# Patient Record
Sex: Female | Born: 1985
Health system: Southern US, Community
[De-identification: ages and names within clinical notes are randomized; demographics above are authoritative.]

## PROBLEM LIST (undated history)

## (undated) DIAGNOSIS — J45909 Unspecified asthma, uncomplicated: Secondary | ICD-10-CM

## (undated) DIAGNOSIS — L309 Dermatitis, unspecified: Secondary | ICD-10-CM

## (undated) DIAGNOSIS — E669 Obesity, unspecified: Secondary | ICD-10-CM

## (undated) DIAGNOSIS — I10 Essential (primary) hypertension: Secondary | ICD-10-CM

## (undated) DIAGNOSIS — L509 Urticaria, unspecified: Secondary | ICD-10-CM

## (undated) HISTORY — DX: Unspecified asthma, uncomplicated: J45.909

## (undated) HISTORY — DX: Urticaria, unspecified: L50.9

## (undated) HISTORY — PX: TYMPANOSTOMY TUBE PLACEMENT: SHX32

## (undated) HISTORY — PX: TUBAL LIGATION: SHX77

## (undated) HISTORY — DX: Dermatitis, unspecified: L30.9

## (undated) HISTORY — DX: Obesity, unspecified: E66.9

## (undated) HISTORY — DX: Essential (primary) hypertension: I10

---
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
## 2006-06-29 ENCOUNTER — Other Ambulatory Visit: Admission: RE | Admit: 2006-06-29 | Discharge: 2006-06-29 | Payer: Self-pay | Admitting: Obstetrics and Gynecology

## 2007-04-03 ENCOUNTER — Emergency Department (HOSPITAL_COMMUNITY): Admission: EM | Admit: 2007-04-03 | Discharge: 2007-04-03 | Payer: Self-pay | Admitting: Emergency Medicine

## 2007-12-11 ENCOUNTER — Inpatient Hospital Stay (HOSPITAL_COMMUNITY): Admission: AD | Admit: 2007-12-11 | Discharge: 2007-12-11 | Payer: Self-pay | Admitting: Obstetrics and Gynecology

## 2007-12-14 ENCOUNTER — Emergency Department (HOSPITAL_COMMUNITY): Admission: EM | Admit: 2007-12-14 | Discharge: 2007-12-14 | Payer: Self-pay | Admitting: Emergency Medicine

## 2008-01-11 ENCOUNTER — Inpatient Hospital Stay (HOSPITAL_COMMUNITY): Admission: AD | Admit: 2008-01-11 | Discharge: 2008-01-13 | Payer: Self-pay | Admitting: Obstetrics and Gynecology

## 2008-08-05 ENCOUNTER — Emergency Department (HOSPITAL_COMMUNITY): Admission: EM | Admit: 2008-08-05 | Discharge: 2008-08-05 | Payer: Self-pay | Admitting: Emergency Medicine

## 2009-02-06 ENCOUNTER — Encounter: Payer: Self-pay | Admitting: Family Medicine

## 2009-04-06 ENCOUNTER — Encounter: Payer: Self-pay | Admitting: Family Medicine

## 2009-04-06 ENCOUNTER — Ambulatory Visit: Payer: Self-pay | Admitting: Family Medicine

## 2009-04-07 ENCOUNTER — Encounter: Payer: Self-pay | Admitting: Family Medicine

## 2009-04-08 LAB — CONVERTED CEMR LAB
Antibody Screen: NEGATIVE
Basophils Absolute: 0 10*3/uL (ref 0.0–0.1)
Basophils Relative: 0 % (ref 0–1)
Eosinophils Absolute: 0.8 10*3/uL — ABNORMAL HIGH (ref 0.0–0.7)
Eosinophils Relative: 8 % — ABNORMAL HIGH (ref 0–5)
HCT: 32.9 % — ABNORMAL LOW (ref 36.0–46.0)
Hemoglobin: 11.1 g/dL — ABNORMAL LOW (ref 12.0–15.0)
Hepatitis B Surface Ag: NEGATIVE
Lymphocytes Relative: 16 % (ref 12–46)
Lymphs Abs: 1.6 10*3/uL (ref 0.7–4.0)
MCHC: 33.7 g/dL (ref 30.0–36.0)
MCV: 78.9 fL (ref 78.0–100.0)
Monocytes Absolute: 0.6 10*3/uL (ref 0.1–1.0)
Monocytes Relative: 6 % (ref 3–12)
Neutro Abs: 7.1 10*3/uL (ref 1.7–7.7)
Neutrophils Relative %: 70 % (ref 43–77)
Platelets: 253 10*3/uL (ref 150–400)
RBC: 4.17 M/uL (ref 3.87–5.11)
RDW: 13.9 % (ref 11.5–15.5)
Rh Type: POSITIVE
Rubella: 28 intl units/mL — ABNORMAL HIGH
Sickle Cell Screen: NEGATIVE
WBC: 10.1 10*3/uL (ref 4.0–10.5)

## 2009-04-13 ENCOUNTER — Ambulatory Visit: Payer: Self-pay | Admitting: Family Medicine

## 2009-04-13 ENCOUNTER — Encounter: Payer: Self-pay | Admitting: *Deleted

## 2009-04-13 ENCOUNTER — Encounter: Payer: Self-pay | Admitting: Family Medicine

## 2009-04-13 ENCOUNTER — Other Ambulatory Visit: Admission: RE | Admit: 2009-04-13 | Discharge: 2009-04-13 | Payer: Self-pay | Admitting: Family Medicine

## 2009-04-13 LAB — CONVERTED CEMR LAB

## 2009-04-14 LAB — CONVERTED CEMR LAB
Chlamydia, DNA Probe: NEGATIVE
GC Probe Amp, Genital: NEGATIVE

## 2009-04-15 ENCOUNTER — Encounter: Payer: Self-pay | Admitting: Family Medicine

## 2009-04-15 ENCOUNTER — Ambulatory Visit (HOSPITAL_COMMUNITY): Admission: RE | Admit: 2009-04-15 | Discharge: 2009-04-15 | Payer: Self-pay | Admitting: Family Medicine

## 2009-05-15 ENCOUNTER — Encounter: Payer: Self-pay | Admitting: Family Medicine

## 2009-05-15 ENCOUNTER — Ambulatory Visit: Payer: Self-pay | Admitting: Family Medicine

## 2009-05-18 ENCOUNTER — Ambulatory Visit: Payer: Self-pay | Admitting: Family Medicine

## 2009-06-12 ENCOUNTER — Ambulatory Visit: Payer: Self-pay | Admitting: Family Medicine

## 2009-06-12 ENCOUNTER — Encounter: Payer: Self-pay | Admitting: Family Medicine

## 2009-06-12 LAB — CONVERTED CEMR LAB
Bilirubin Urine: NEGATIVE
Blood in Urine, dipstick: NEGATIVE
Glucose, Urine, Semiquant: NEGATIVE
Ketones, urine, test strip: NEGATIVE
Nitrite: NEGATIVE
Specific Gravity, Urine: >=1.03
Urobilinogen, UA: 1
WBC Urine, dipstick: NEGATIVE
pH: 6.5

## 2009-07-04 ENCOUNTER — Inpatient Hospital Stay (HOSPITAL_COMMUNITY): Admission: AD | Admit: 2009-07-04 | Discharge: 2009-07-04 | Payer: Self-pay | Admitting: Obstetrics and Gynecology

## 2009-07-13 ENCOUNTER — Ambulatory Visit: Payer: Self-pay | Admitting: Family Medicine

## 2009-07-13 ENCOUNTER — Encounter: Payer: Self-pay | Admitting: Family Medicine

## 2009-07-14 LAB — CONVERTED CEMR LAB
HCT: 31.9 % — ABNORMAL LOW (ref 36.0–46.0)
Hemoglobin: 10.2 g/dL — ABNORMAL LOW (ref 12.0–15.0)
MCHC: 32 g/dL (ref 30.0–36.0)
MCV: 83.7 fL (ref 78.0–100.0)
Platelets: 228 10*3/uL (ref 150–400)
RBC: 3.81 M/uL — ABNORMAL LOW (ref 3.87–5.11)
RDW: 13.5 % (ref 11.5–15.5)
WBC: 10.5 10*3/uL (ref 4.0–10.5)

## 2009-07-28 ENCOUNTER — Encounter: Payer: Self-pay | Admitting: Family Medicine

## 2009-07-28 ENCOUNTER — Ambulatory Visit: Payer: Self-pay | Admitting: Family Medicine

## 2009-08-11 ENCOUNTER — Ambulatory Visit: Payer: Self-pay | Admitting: Family Medicine

## 2009-08-11 ENCOUNTER — Encounter: Payer: Self-pay | Admitting: Family Medicine

## 2009-08-19 ENCOUNTER — Ambulatory Visit: Payer: Self-pay | Admitting: Family Medicine

## 2009-08-19 ENCOUNTER — Encounter: Payer: Self-pay | Admitting: Family Medicine

## 2009-08-19 LAB — CONVERTED CEMR LAB
Chlamydia, DNA Probe: NEGATIVE
GC Probe Amp, Genital: NEGATIVE

## 2009-08-25 ENCOUNTER — Inpatient Hospital Stay (HOSPITAL_COMMUNITY): Admission: AD | Admit: 2009-08-25 | Discharge: 2009-08-27 | Payer: Self-pay | Admitting: Obstetrics & Gynecology

## 2009-08-25 ENCOUNTER — Ambulatory Visit: Payer: Self-pay | Admitting: Obstetrics and Gynecology

## 2009-09-08 ENCOUNTER — Telehealth: Payer: Self-pay | Admitting: Family Medicine

## 2009-10-02 ENCOUNTER — Ambulatory Visit: Payer: Self-pay | Admitting: Obstetrics & Gynecology

## 2009-10-16 ENCOUNTER — Ambulatory Visit: Payer: Self-pay | Admitting: Obstetrics and Gynecology

## 2009-10-16 ENCOUNTER — Ambulatory Visit (HOSPITAL_COMMUNITY): Admission: RE | Admit: 2009-10-16 | Discharge: 2009-10-16 | Payer: Self-pay | Admitting: Obstetrics and Gynecology

## 2009-11-12 ENCOUNTER — Ambulatory Visit: Payer: Self-pay | Admitting: Obstetrics and Gynecology

## 2010-03-30 ENCOUNTER — Ambulatory Visit: Payer: Self-pay | Admitting: Family Medicine

## 2010-03-30 DIAGNOSIS — L732 Hidradenitis suppurativa: Secondary | ICD-10-CM | POA: Insufficient documentation

## 2010-03-30 DIAGNOSIS — I10 Essential (primary) hypertension: Secondary | ICD-10-CM | POA: Insufficient documentation

## 2010-04-25 IMAGING — US US OB DETAIL+14 WK
1 of 2 series · 14 of 28 positions shown · non-contrast
Comparison: none

OBSTETRICAL ULTRASOUND:
 This ultrasound exam was performed in the [HOSPITAL] Ultrasound Department.  The OB US report was generated in the AS system, and faxed to the ordering physician.  This report is also available in [REDACTED] PACS.

[Series 1: us ob detail +14 wk · 0.21mm/px · 14 of 81 slices shown]
[im 1/81]
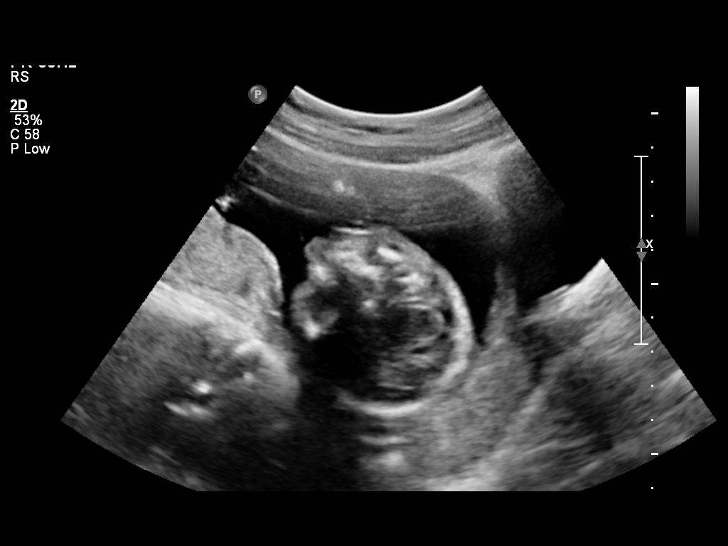
[im 7/81]
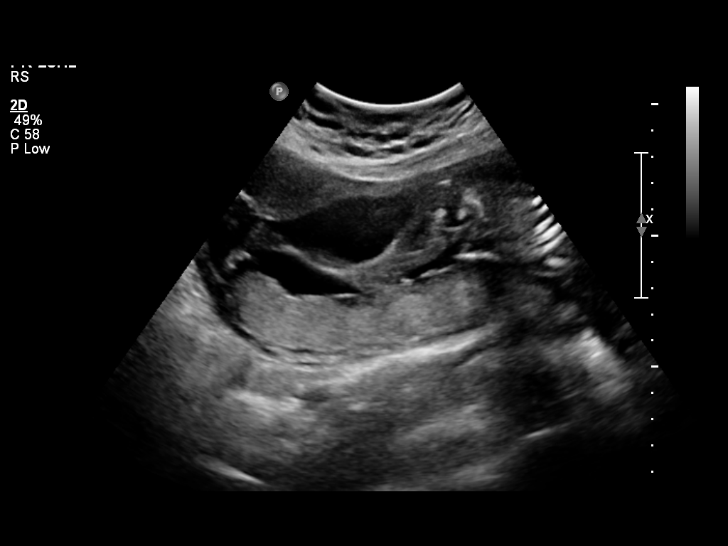
[im 13/81]
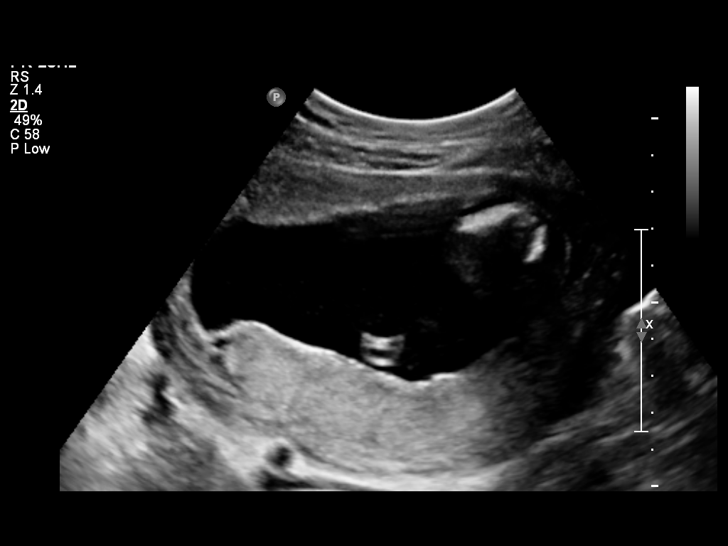
[im 19/81]
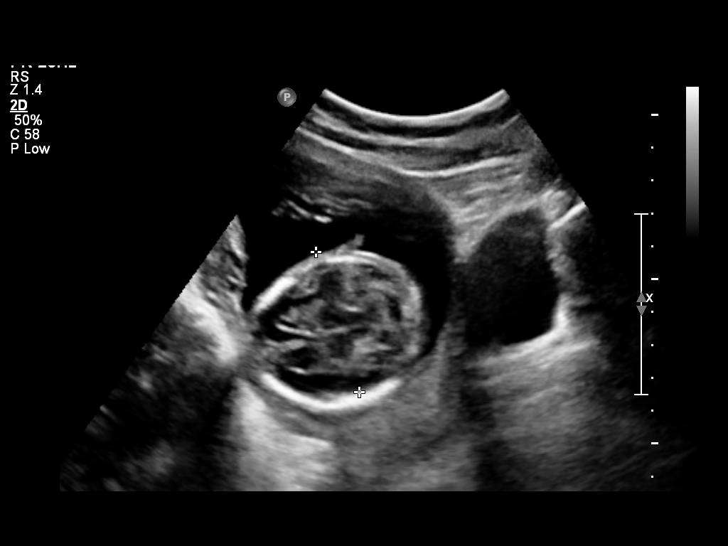
[im 25/81]
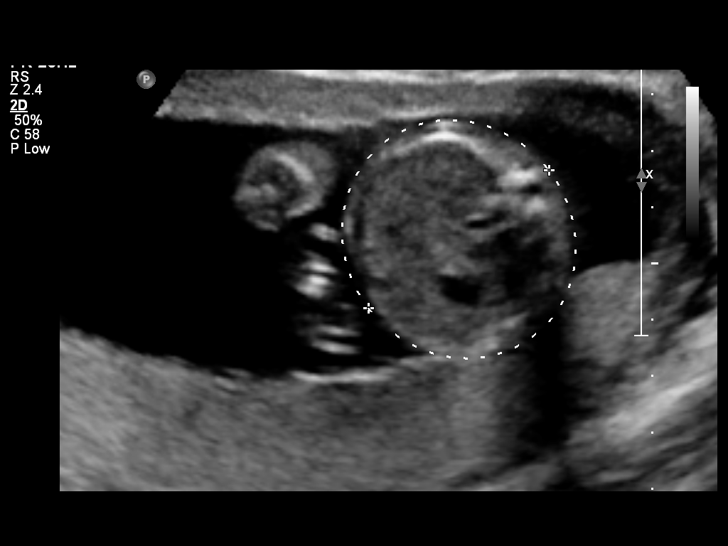
[im 31/81]
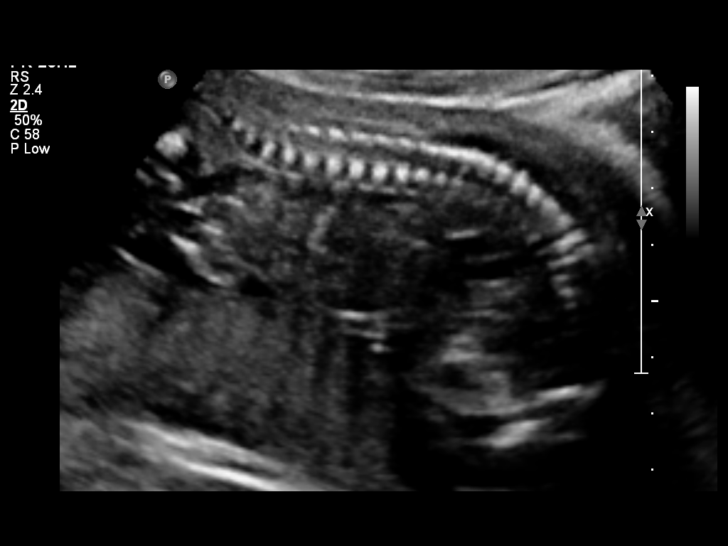
[im 37/81]
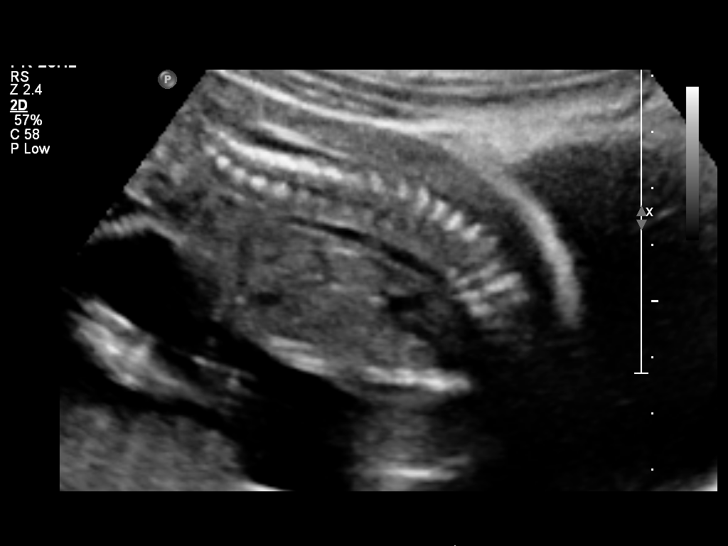
[im 44/81]
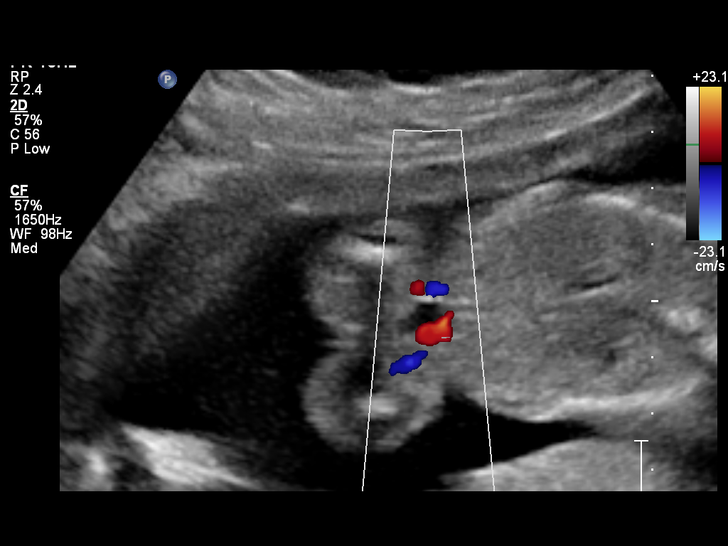
[im 50/81]
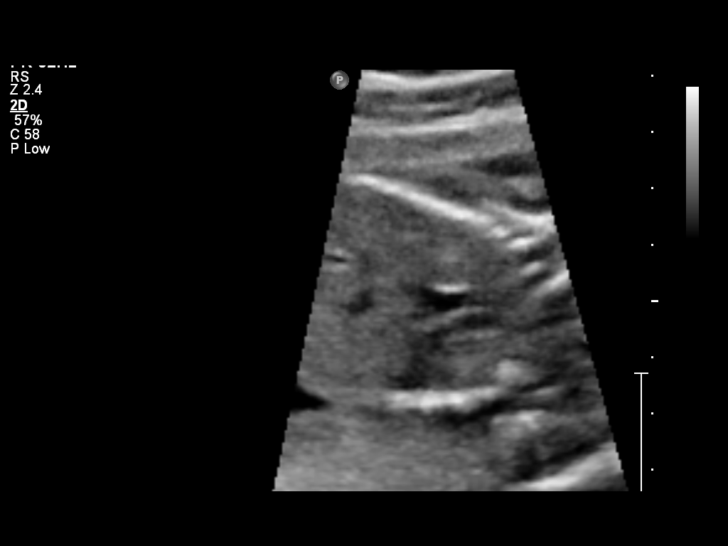
[im 56/81]
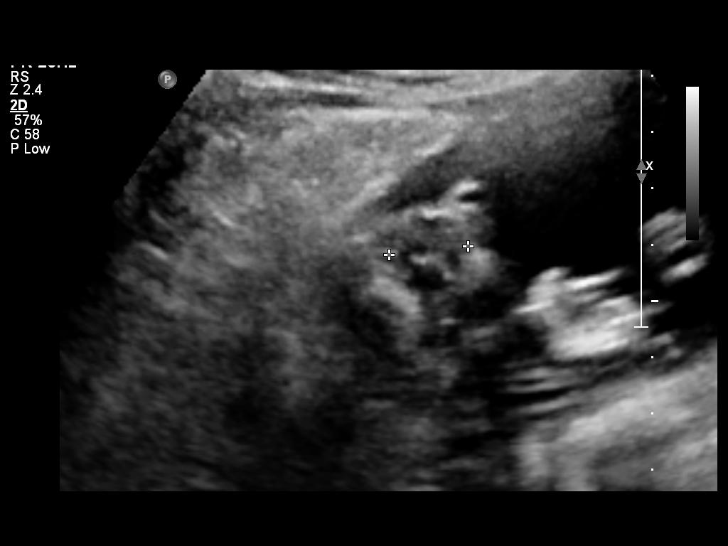
[im 62/81]
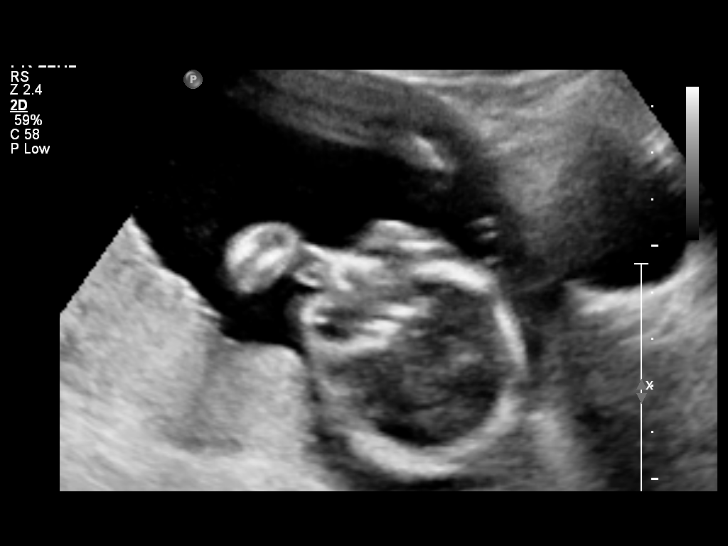
[im 68/81]
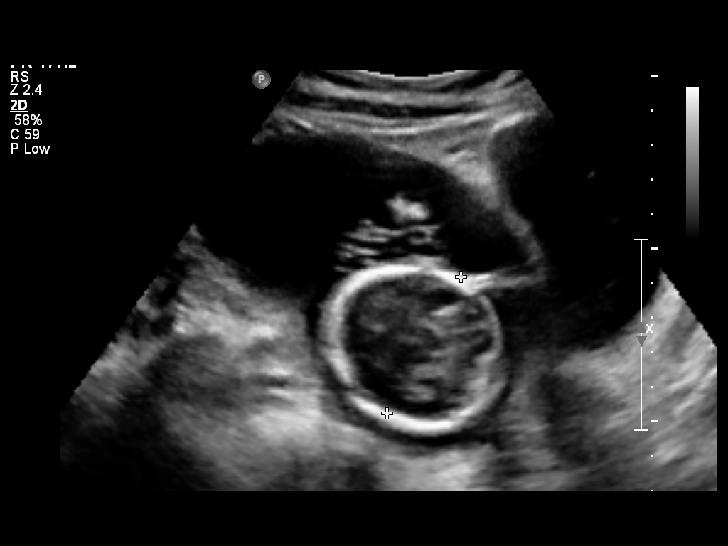
[im 74/81]
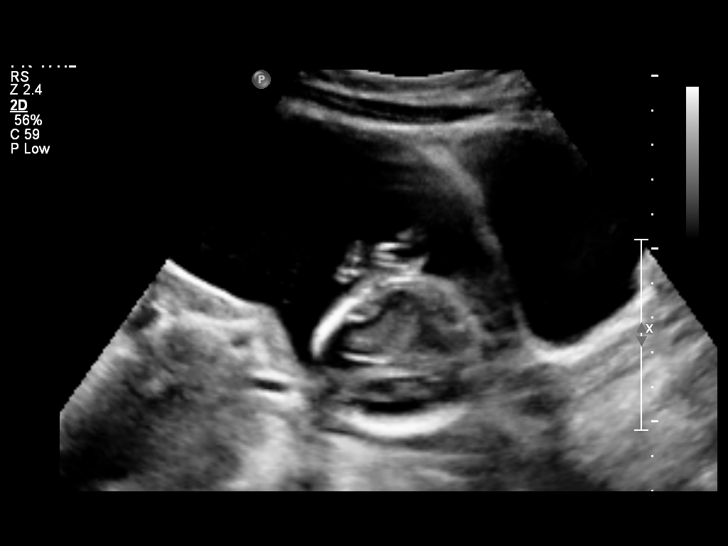
[im 81/81]
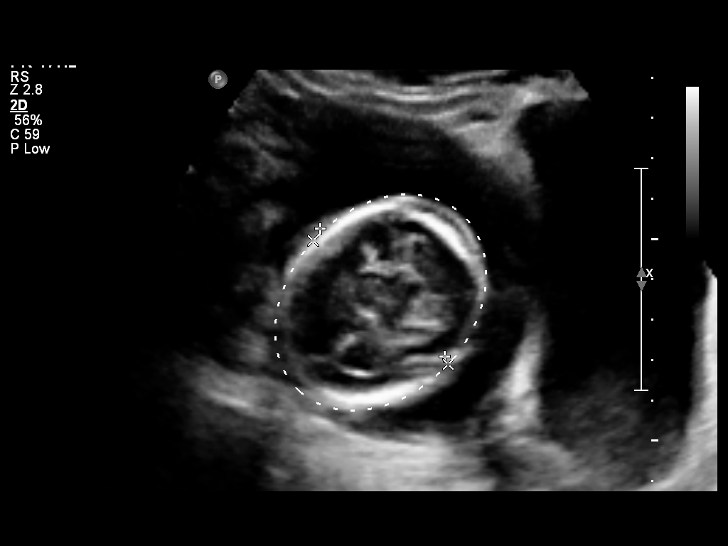

[14 of 28 positions shown; findings below may reference images not displayed]

IMPRESSION: See AS Obstetric US report.

## 2010-06-07 ENCOUNTER — Emergency Department (HOSPITAL_COMMUNITY): Admission: EM | Admit: 2010-06-07 | Discharge: 2010-06-07 | Payer: Self-pay | Admitting: Emergency Medicine

## 2011-01-13 NOTE — Assessment & Plan Note (Signed)
Summary: bumps under arm,tcb   Vital Signs:  Patient profile:   25 year old female Height:      62.5 inches Weight:      181.7 pounds BMI:     32.82 Temp:     98.4 degrees F oral Pulse rate:   93 / minute BP sitting:   151 / 99  (left arm) Cuff size:   regular  Vitals Entered By: Garen Grams LPN (March 30, 2010 9:41 AM) CC: painful bumps in left axilla Is Patient Diabetic? No   CC:  painful bumps in left axilla.  History of Present Illness: 1. painful bumps under the arm Stats that she's had painful bumps under L arm and one under the R arm. Has had them come and go since July of last year. A few have spontaneously drained. Has put a warm wrag on them occasionally. Has a dominant bump under L arm that has been persistent and particularly painful.   fevers:  no   chills: no    nausea: yes    vomiting: no    no other similar areas elsewhere on body.  no family history of a similar problem.   Habits & Providers  Alcohol-Tobacco-Diet     Tobacco Status: never  Current Medications (verified): 1)  None  Allergies (verified): No Known Drug Allergies  Social History: Smoking Status:  never  Physical Exam  General:  vitals signs reviewed -- hypertensive but otherwise normal  Skin:  R axilla reveals 5 x 7 mm faruncle in lower posterior portion; no erythema, drainage, or discharge   L axilla reviews mulitple faruncles with one dominant lesion in the upper posterior portion; signs of recent drainage but no active drainage or discharge  exam consistent with hidradenitis Psych:  alert and oriented. full affect, normally interactive. Good eye contact.    Impression & Recommendations:  Problem # 1:  HIDRADENITIS SUPPURATIVA (AOZ-308.65) Assessment New  ongoing since July. No family history. Given persistent problem will treat with doxy for one week followed by topical clinda lotion. Advised to avoid shaving close and other hygeinic measures; gave handout on the disease.  Would avoid I&D unless pt can't tolerate the pain. Consider intralesional steroids if she presents with an early lesion. No constitutional symptoms. Pt is bottle feeding and has NKDA.   Orders: [facility]- Est  Level 4 (99214)  Problem # 2:  ELEVATED BLOOD PRESSURE WITHOUT DIAGNOSIS OF HYPERTENSION (ICD-796.2) will have back in 2-3 weeks for recheck and visit with Dr. Wallene Huh.  Complete Medication List: 1)  Doxycycline Hyclate 100 Mg Tabs (Doxycycline hyclate) .... One by mouth two times a day for 7 days 2)  Clindamycin Phosphate 1 % Lotn (Clindamycin phosphate) .... Apply under arms two times a day  Patient Instructions: 1)  read the handout 2)  take the antibiotic two times a day for 7 days 3)  start the clindamycin lotion after finishing the oral antibiotic. use this to keep the areas from recurring. 4)  apply warm compresses every 1-2 hours for your current sore areas and any new areas that occur 5)  if you have an area that starts and doesn't improve with warm compresses after 24 hours, be seen here because it's easier to treat them early on.  Prescriptions: CLINDAMYCIN PHOSPHATE 1 % LOTN (CLINDAMYCIN PHOSPHATE) apply under arms two times a day  #60 g x 1   Entered and Authorized by:   Myrtie Soman  MD   Signed by:   Myrtie Soman  MD on 03/30/2010   Method used:   Electronically to        [facility] (retail)       [address]       [place], [place]  [address]       Ph: [phone]       Fax: [phone]   RxID:   [id number] DOXYCYCLINE HYCLATE 100 MG TABS (DOXYCYCLINE HYCLATE) one by mouth two times a day for 7 days  #14 x 0   Entered and Authorized by:   Myrtie Soman  MD   Signed by:   Myrtie Soman  MD on 03/30/2010   Method used:   Electronically to        [facility] (retail)       [address]       [place], [place]  [address]       Ph: [phone]       Fax: [phone]   RxID:    [id number]

## 2011-01-18 ENCOUNTER — Emergency Department (HOSPITAL_COMMUNITY)
Admission: EM | Admit: 2011-01-18 | Discharge: 2011-01-18 | Disposition: A | Discharge status: Home / Self Care | Payer: Self-pay | Attending: Emergency Medicine | Admitting: Emergency Medicine

## 2011-01-18 DIAGNOSIS — IMO0001 Reserved for inherently not codable concepts without codable children: Secondary | ICD-10-CM | POA: Insufficient documentation

## 2011-01-18 DIAGNOSIS — R05 Cough: Secondary | ICD-10-CM | POA: Insufficient documentation

## 2011-01-18 DIAGNOSIS — J40 Bronchitis, not specified as acute or chronic: Secondary | ICD-10-CM | POA: Insufficient documentation

## 2011-01-18 DIAGNOSIS — R079 Chest pain, unspecified: Secondary | ICD-10-CM | POA: Insufficient documentation

## 2011-01-18 DIAGNOSIS — J069 Acute upper respiratory infection, unspecified: Secondary | ICD-10-CM | POA: Insufficient documentation

## 2011-01-18 DIAGNOSIS — R059 Cough, unspecified: Secondary | ICD-10-CM | POA: Insufficient documentation

## 2011-03-16 LAB — CBC
HCT: 36.1 % (ref 36.0–46.0)
Hemoglobin: 11.7 g/dL — ABNORMAL LOW (ref 12.0–15.0)
WBC: 7.9 10*3/uL (ref 4.0–10.5)

## 2011-03-18 LAB — CBC
HCT: 33.4 % — ABNORMAL LOW (ref 36.0–46.0)
Hemoglobin: 9.9 g/dL — ABNORMAL LOW (ref 12.0–15.0)
MCHC: 33.7 g/dL (ref 30.0–36.0)
Platelets: 263 10*3/uL (ref 150–400)
RBC: 3.44 MIL/uL — ABNORMAL LOW (ref 3.87–5.11)
WBC: 15.4 10*3/uL — ABNORMAL HIGH (ref 4.0–10.5)

## 2011-03-18 LAB — RPR: RPR Ser Ql: NONREACTIVE

## 2011-03-20 LAB — URINALYSIS, ROUTINE W REFLEX MICROSCOPIC
Bilirubin Urine: NEGATIVE
Glucose, UA: NEGATIVE mg/dL
Ketones, ur: NEGATIVE mg/dL
Nitrite: NEGATIVE
Specific Gravity, Urine: 1.015 (ref 1.005–1.030)
Urobilinogen, UA: 0.2 mg/dL (ref 0.0–1.0)
pH: 7 (ref 5.0–8.0)

## 2011-03-20 LAB — URINE MICROSCOPIC-ADD ON: RBC / HPF: NONE SEEN RBC/hpf (ref <3)

## 2011-03-21 LAB — GLUCOSE, CAPILLARY: Glucose-Capillary: 107 mg/dL — ABNORMAL HIGH (ref 70–99)

## 2011-08-19 ENCOUNTER — Emergency Department (HOSPITAL_COMMUNITY)
Admission: EM | Admit: 2011-08-19 | Discharge: 2011-08-19 | Disposition: A | Discharge status: Home / Self Care | Payer: Self-pay | Attending: Emergency Medicine | Admitting: Emergency Medicine

## 2011-08-19 DIAGNOSIS — S139XXA Sprain of joints and ligaments of unspecified parts of neck, initial encounter: Secondary | ICD-10-CM | POA: Insufficient documentation

## 2011-08-19 DIAGNOSIS — W108XXA Fall (on) (from) other stairs and steps, initial encounter: Secondary | ICD-10-CM | POA: Insufficient documentation

## 2011-08-19 DIAGNOSIS — M546 Pain in thoracic spine: Secondary | ICD-10-CM | POA: Insufficient documentation

## 2011-08-19 DIAGNOSIS — M545 Low back pain, unspecified: Secondary | ICD-10-CM | POA: Insufficient documentation

## 2011-08-19 DIAGNOSIS — Y92009 Unspecified place in unspecified non-institutional (private) residence as the place of occurrence of the external cause: Secondary | ICD-10-CM | POA: Insufficient documentation

## 2011-08-19 DIAGNOSIS — M542 Cervicalgia: Secondary | ICD-10-CM | POA: Insufficient documentation

## 2011-08-19 DIAGNOSIS — IMO0001 Reserved for inherently not codable concepts without codable children: Secondary | ICD-10-CM | POA: Insufficient documentation

## 2011-09-01 LAB — CBC
HCT: 31.6 — ABNORMAL LOW
MCV: 83.1
RBC: 3.81 — ABNORMAL LOW
WBC: 14.7 — ABNORMAL HIGH

## 2011-09-01 LAB — RPR: RPR Ser Ql: NONREACTIVE

## 2011-09-02 LAB — CBC
HCT: 27.2 — ABNORMAL LOW
Hemoglobin: 9.2 — ABNORMAL LOW
MCHC: 33.8
RBC: 3.24 — ABNORMAL LOW
RDW: 13.5

## 2012-12-27 ENCOUNTER — Encounter: Payer: Self-pay | Admitting: Family Medicine

## 2012-12-27 ENCOUNTER — Ambulatory Visit (INDEPENDENT_AMBULATORY_CARE_PROVIDER_SITE_OTHER): Payer: BC Managed Care – PPO | Admitting: Family Medicine

## 2012-12-27 VITALS — BP 150/70 | HR 73 | Temp 98.7°F | Ht 62.5 in | Wt 192.0 lb

## 2012-12-27 DIAGNOSIS — I10 Essential (primary) hypertension: Secondary | ICD-10-CM

## 2012-12-27 DIAGNOSIS — E669 Obesity, unspecified: Secondary | ICD-10-CM

## 2012-12-27 LAB — LDL CHOLESTEROL, DIRECT: Direct LDL: 81 mg/dL

## 2012-12-27 LAB — CBC
HCT: 35.2 % — ABNORMAL LOW (ref 36.0–46.0)
MCH: 26.3 pg (ref 26.0–34.0)
MCV: 79.1 fL (ref 78.0–100.0)
Platelets: 329 10*3/uL (ref 150–400)
RBC: 4.45 MIL/uL (ref 3.87–5.11)

## 2012-12-27 LAB — COMPREHENSIVE METABOLIC PANEL
ALT: 12 U/L (ref 0–35)
BUN: 9 mg/dL (ref 6–23)
CO2: 29 mEq/L (ref 19–32)
Calcium: 9.5 mg/dL (ref 8.4–10.5)
Chloride: 100 mEq/L (ref 96–112)
Creat: 0.55 mg/dL (ref 0.50–1.10)
Glucose, Bld: 97 mg/dL (ref 70–99)
Total Bilirubin: 0.4 mg/dL (ref 0.3–1.2)

## 2012-12-27 LAB — POCT GLYCOSYLATED HEMOGLOBIN (HGB A1C): Hemoglobin A1C: 5.4

## 2012-12-27 MED ORDER — AMLODIPINE BESYLATE 5 MG PO TABS: 5.0000 mg | ORAL_TABLET | Freq: Every day | ORAL | Status: DC

## 2012-12-27 NOTE — Patient Instructions (Signed)
Your blood pressure is high.  We need to try both medicine and non medicine. See the dash diet handout I gave you. Take amlodipine daily. Follow up with me in 2 weeks!   Thanks, Dr. Durene Cal  Health Maintenance Due  Topic Date Due  . Pap Smear  10/16/2004  . Tetanus/tdap  10/16/2005  . Influenza Vaccine  08/12/2012

## 2012-12-27 NOTE — Assessment & Plan Note (Addendum)
Poorly controlled. Handout given for dash diet. Will start amlodipine. Likely will need second agent.  Basic labs-CBC, CMET, LDL. A1c checked due to obesity and not elevated at 5.4. Some concern due to early onset and patient age for secondary causes but will treat initially and see if responds to medications.

## 2012-12-27 NOTE — Progress Notes (Signed)
Subjective:  Same Day appointment  1. Hypertension- BP Readings from Last 3 Encounters:  12/27/12 150/70  03/30/10 151/99  08/19/09 123/84   Home BP monitoring-yes, states typically 150/100 but has seen as high as 196/150 and initial BP today noted 182/138 on automatic but manual improved Better with-decreased pork intake Worse with-appears to be higher when stressed at home Compliant with medications-No home meds Duration-started with last pregnancy but hasnt been checking until last month and noted continuously elevated Exercise-states zumba 3x a week Diet-not particularly high in fruits/veggies but does not drink many sugar sweetened beverages and avoids eating out except for once a week.  Denies any CP, HA, change in SOB (states has baseline asthma although not noted in record),  LE edema, transient weakness, orthopnea, PND. Does have some blurry vision but patient states needs eye doctors. She does think gets blurry vision sometimes when BP home for transient periods < 1 minute.   ROS--See HPI  Past Medical History-listed as elevated BP, obesity Family History-HTN extensive in family including mother, mother with breast cancer at 64 and died at that time Surgical History-bilateral tubal ligation  Reviewed problem list.  Medications- reviewed and updated Chief complaint-noted  Objective: BP 150/70  Pulse 73  Temp 98.7 F (37.1 C) (Oral)  Ht 5' 2.5" (1.588 m)  Wt 192 lb (87.091 kg)  BMI 34.56 kg/m2  LMP 11/22/2012 Gen: NAD, resting comfortably in bed HEENT: NCAT, MMM, PERRLA, unable to visualize optic disc on fundoscopic exam CV: RRR no mrg  Lungs: CTAB  Abd: soft/nontender/nondistended/normal bowel sounds  MSK: moves all extremities, no edema  Skin: warm and dry, no rash  Neuro: CN II-XII intact, sensation and reflexes normal throughout, 5/5 muscle strength in bilateral upper and lower extremities. Normal finger to nose. Normal rapid alternating movements.    Assessment/Plan: See problem oriented charted  Patient to return in 2 weeks for follow up and discussion of health maintenance including future planning for breast cancer screening given early breast cancer in mother.

## 2012-12-28 ENCOUNTER — Telehealth: Payer: Self-pay | Admitting: Family Medicine

## 2012-12-28 NOTE — Telephone Encounter (Signed)
Can try iron for hgb. Other labs unremarkable. Informed patient.

## 2013-01-08 ENCOUNTER — Encounter: Payer: Self-pay | Admitting: Family Medicine

## 2013-01-08 ENCOUNTER — Other Ambulatory Visit (HOSPITAL_COMMUNITY)
Admission: RE | Admit: 2013-01-08 | Discharge: 2013-01-08 | Disposition: A | Discharge status: Home / Self Care | Payer: BC Managed Care – PPO | Source: Ambulatory Visit | Attending: Family Medicine | Admitting: Family Medicine

## 2013-01-08 ENCOUNTER — Ambulatory Visit (INDEPENDENT_AMBULATORY_CARE_PROVIDER_SITE_OTHER): Payer: BC Managed Care – PPO | Admitting: Family Medicine

## 2013-01-08 VITALS — BP 144/87 | HR 89 | Temp 98.4°F | Wt 194.0 lb

## 2013-01-08 DIAGNOSIS — Z113 Encounter for screening for infections with a predominantly sexual mode of transmission: Secondary | ICD-10-CM | POA: Insufficient documentation

## 2013-01-08 DIAGNOSIS — Z803 Family history of malignant neoplasm of breast: Secondary | ICD-10-CM

## 2013-01-08 DIAGNOSIS — Z7251 High risk heterosexual behavior: Secondary | ICD-10-CM

## 2013-01-08 DIAGNOSIS — Z01419 Encounter for gynecological examination (general) (routine) without abnormal findings: Secondary | ICD-10-CM | POA: Insufficient documentation

## 2013-01-08 DIAGNOSIS — Z202 Contact with and (suspected) exposure to infections with a predominantly sexual mode of transmission: Secondary | ICD-10-CM

## 2013-01-08 DIAGNOSIS — I10 Essential (primary) hypertension: Secondary | ICD-10-CM

## 2013-01-08 DIAGNOSIS — Z9189 Other specified personal risk factors, not elsewhere classified: Secondary | ICD-10-CM

## 2013-01-08 LAB — POCT WET PREP (WET MOUNT)

## 2013-01-08 LAB — POCT URINE PREGNANCY: Preg Test, Ur: NEGATIVE

## 2013-01-08 LAB — HIV ANTIBODY (ROUTINE TESTING W REFLEX): HIV: NONREACTIVE

## 2013-01-08 NOTE — Assessment & Plan Note (Addendum)
Patient here to discuss any screening she needs due to FH of cervical and breast cancers. - Pap smear today, given last pap was 3 y ago (have always been normal) - call pt if abnormal - Discussed with patient to contact [facility] for genetic counseling and benefits of speaking with deceased mother's PCP who may be able to provide more information on breast cancer type (whether BRCA positive) and screening recommendations for [name]. Continue discussion of increased screening with Dr. Durene Cal.  Can eventually consider early mammogram or mammogram + MRI or [place].  Pt understands and agrees to contact [facility] genetic counselor for help gathering information about her risk, and discussing with Dr. Durene Cal on f/u. - F/u in 1 month.

## 2013-01-08 NOTE — Patient Instructions (Addendum)
It was very nice to meet you today.  For your high blood pressure: - Continue taking amlodipine 5mg  daily.  Some side effects to look out for are leg swelling, muscle cramps, fatigue.  If you have any questions or concerning side effects, please do let us know. - Continue trying to lose weight.  I see when you were 177 lbs you had no issues with blood pressure, so I think continuing to work on that will really help.  You are doing a great job so far - keep it up (reducing salty foods, eating plenty of fruits and vegetables, walking / exercising more).  For your family history of cervical and breast cancer: - We did a pap smear today.  We also did STD screening.  I will call you if anything is abnormal, and otherwise I will send you a letter.  If you do not hear from [place], please do call and we will get you results. - I recommend talking to [employer] genetic counselor, as they can help evaluate your family history and help you get information from your mother's doctor, to help [place] better understand what screening you need.  We are doing a urine pregnancy test today because you said your period is 2 weeks late.  Although you have had your tubes tied, I want to make sure.  Thank you.  Have a good day and stay warm.

## 2013-01-08 NOTE — Assessment & Plan Note (Addendum)
Pt requests STD screening today due to unprotected sex with new sexual partner in past 3 years and a while since no STD screening. - GC/Chlamydia along with pap smear, wet prep, HIV and RPR today. Call pt with any abnormal results. - Urine pregnancy test due to period being 2 weeks late and h/o unprotected sex to r/o pregnancy. Pt with BTL, which would make ectopic possible though unlikely due to no abdominal pain. - Recommended condoms.

## 2013-01-08 NOTE — Assessment & Plan Note (Addendum)
Not adequately controlled yet, but pt just started amlodipine ~2 weeks ago and is working on lifestyle to reduce weight.  Medication side effects minimal to none currently, and pt making good effort to eat more fruits/veg and less salt and to increase exercise. - Continue amlodipine 5mg  daily.  - Discussed side effect profile (leg swelling, m cramping, fatigue) and to contact PCP if concerning side effects. - Continue attempts to lose weight, as pt's BP was well controlled prior to gaining weight. - F/u in 1 month.

## 2013-01-08 NOTE — Progress Notes (Signed)
Subjective:     Patient ID: Cynthia Carpenter, female   DOB: 1986-01-10, 27 y.o.   MRN: [id number]  CC - follow up of HTN and FH of cervical and breast cancer  HPI  Cynthia Carpenter is a 27 y.o. female with h/o recently diagnosed HTN and FH positive for breast and cervical cancers here for f/u.    HTN: Pt was started on amlodipine 5mg  daily ~2 weeks ago.  She initially had headaches but is tolerating medication now and takes at the same time daily, with only complaint being mild shooting pain and numbness from right knee to foot, that is more frustrating than painful.  Also reports that she had been exercising three times weekly and has increased exercise since last visit with Dr. Durene Cal by walking to all classes instead of taking bus (an additional ~2 miles daily).  She is eating more fruits/veg and has cut back on pork.  Denies headaches, dizziness, blurry vision. Checks BP at home and remains in 140s/80s range.  FH of HTN in PGM, paternal uncle, MGM, and possibly father  FH positive for breast and cervical cancers: Pt reports her mother died of breast cancer in her 55s, MGM died of cervical cancer, and MGM's 2 sisters had cancer (1 with cervical, 1 with breast) that rapidly progressed.  She has gone 3 years without pap smear and would like one today, along with STD screening.  In the past 3 years, she has had 2 new sexual partners and has not consistently used condoms.  Review of Systems  Also reports period is 2 weeks late.   Past Medical History  Diagnosis Date  . Hypertension   . Obesity    PMH, SH, and FH reviewed and patient reports no changes, other than the following.  - Also reports taking iron. - Last pap smear 3 years ago and have always been normal - FH of HTN and cancer as described in HPI. - SH: denies tobacco or drug use; drinks 1 glass of wine at most 2x/week     Objective:   Physical Exam BP 144/87  Pulse 89  Temp 98.4 F (36.9 C) (Oral)  Wt 194 lb (87.998 kg)   LMP 11/22/2012 GEN: NAD, pleasant, sitting on exam table CV: RRR, no murmurs, rubs, or gallops, though heart sounds distant, S1 more distant than S2 PULM: CTAB with no wheezes or crackles; normal effort SKIN: No rash or cyanosis MSK/EXTR: Full spontaneous ROM, no clubbing or edema NEURO: Alert and oriented, no focal deficits, normal speech and gait GU: Normal external genitalia, vaginal mucosa, and cervix visualized; small amount of discharge in vaginal vault; no cervical motion tenderness or masses palpated on bimanual exam    Assessment:     Cynthia Carpenter is a 27 y.o. female with h/o recently diagnosed HTN and FH positive for breast and cervical cancers here for f/u and wanting STD screen.       Plan:

## 2013-01-09 ENCOUNTER — Telehealth: Payer: Self-pay | Admitting: Family Medicine

## 2013-01-09 DIAGNOSIS — N898 Other specified noninflammatory disorders of vagina: Secondary | ICD-10-CM

## 2013-01-09 LAB — RPR

## 2013-01-09 MED ORDER — METRONIDAZOLE 500 MG PO TABS: 500.0000 mg | ORAL_TABLET | Freq: Two times a day (BID) | ORAL | Status: AC

## 2013-01-09 NOTE — Telephone Encounter (Signed)
Called to inform pt of negative test results (GC/Chlamydia, HIV, RPR, trichomonas) but positive for moderate cluse cells.  Patient reports foul-smelling vaginal discharge and would like something for treatment.  - Prescribing metronidazole 500mg  BID x 7 days. - Pt in agreement with plan.

## 2013-01-09 NOTE — Telephone Encounter (Signed)
Clue cells

## 2013-01-10 ENCOUNTER — Encounter: Payer: Self-pay | Admitting: Family Medicine

## 2013-01-15 ENCOUNTER — Encounter: Payer: Self-pay | Admitting: Family Medicine

## 2013-01-26 ENCOUNTER — Other Ambulatory Visit: Payer: Self-pay

## 2013-02-14 ENCOUNTER — Ambulatory Visit: Payer: BC Managed Care – PPO | Admitting: Family Medicine

## 2013-10-17 ENCOUNTER — Other Ambulatory Visit: Payer: Self-pay

## 2014-08-15 ENCOUNTER — Emergency Department (HOSPITAL_COMMUNITY)
Admission: EM | Admit: 2014-08-15 | Discharge: 2014-08-15 | Disposition: A | Discharge status: Home / Self Care | Payer: BC Managed Care – PPO | Source: Home / Self Care | Attending: Family Medicine | Admitting: Family Medicine

## 2014-08-15 ENCOUNTER — Encounter (HOSPITAL_COMMUNITY): Payer: Self-pay | Admitting: Emergency Medicine

## 2014-08-15 ENCOUNTER — Other Ambulatory Visit (HOSPITAL_COMMUNITY)
Admission: RE | Admit: 2014-08-15 | Discharge: 2014-08-15 | Disposition: A | Discharge status: Home / Self Care | Payer: BC Managed Care – PPO | Source: Ambulatory Visit | Attending: Family Medicine | Admitting: Family Medicine

## 2014-08-15 DIAGNOSIS — Z113 Encounter for screening for infections with a predominantly sexual mode of transmission: Secondary | ICD-10-CM | POA: Insufficient documentation

## 2014-08-15 DIAGNOSIS — N760 Acute vaginitis: Secondary | ICD-10-CM | POA: Diagnosis present

## 2014-08-15 LAB — POCT PREGNANCY, URINE: Preg Test, Ur: NEGATIVE

## 2014-08-15 MED ORDER — AZITHROMYCIN 250 MG PO TABS
1000.0000 mg | ORAL_TABLET | Freq: Once | ORAL | Status: AC
  Administered 2014-08-15: 1000 mg via ORAL

## 2014-08-15 MED ORDER — LIDOCAINE HCL (PF) 1 % IJ SOLN
INTRAMUSCULAR | Status: AC
  Filled 2014-08-15: qty 5

## 2014-08-15 MED ORDER — CEFTRIAXONE SODIUM 250 MG IJ SOLR
INTRAMUSCULAR | Status: AC
  Filled 2014-08-15: qty 250

## 2014-08-15 MED ORDER — AZITHROMYCIN 250 MG PO TABS
ORAL_TABLET | ORAL | Status: AC
  Filled 2014-08-15: qty 4

## 2014-08-15 MED ORDER — CEFTRIAXONE SODIUM 250 MG IJ SOLR
250.0000 mg | Freq: Once | INTRAMUSCULAR | Status: AC
  Administered 2014-08-15: 250 mg via INTRAMUSCULAR

## 2014-08-15 NOTE — ED Provider Notes (Signed)
Cynthia Carpenter is a 28 y.o. female who presents to Urgent Care today for pelvic pain. Patient sexual partner recently tested positive for Chlamydia. Patient is here for treatment. She notes very mild intermittent pelvic pain. No fevers or chills nausea vomiting or diarrhea. No vaginal discharge.  Surgical history significant for bilateral tubal ligation. Past Medical History  Diagnosis Date  . Hypertension   . Obesity    History  Substance Use Topics  . Smoking status: Never Smoker   . Smokeless tobacco: Not on file  . Alcohol Use: Yes   ROS as above Medications: Current Facility-Administered Medications  Medication Dose Route Frequency Provider Last Rate Last Dose  . azithromycin (ZITHROMAX) tablet 1,000 mg  1,000 mg Oral Once Gregor Hams, MD      . cefTRIAXone (ROCEPHIN) injection 250 mg  250 mg Intramuscular Once Gregor Hams, MD       Current Outpatient Prescriptions  Medication Sig Dispense Refill  . LOSARTAN POTASSIUM PO Take by mouth.      [facility] OVER THE COUNTER MEDICATION hctz      . amLODipine (NORVASC) 5 MG tablet Take 1 tablet (5 mg total) by mouth daily.  30 tablet  2    Exam:  BP 136/86  Pulse 75  Temp(Src) 99.2 F (37.3 C) (Oral)  Resp 16  SpO2 100% Gen: Well NAD HEENT: EOMI,  MMM Lungs: Normal work of breathing. CTABL Heart: RRR no MRG Abd: NABS, Soft. Nondistended, Nontender Exts: Brisk capillary refill, warm and well perfused.  GYN: Normal external genitalia. Vaginal canal is thin yellowish discharge. Cervix with yellowish discharge emerging. Nontender  Results for orders placed during the hospital encounter of 08/15/14 (from the past 24 hour(s))  POCT PREGNANCY, URINE     Status: None   Collection Time    08/15/14  5:19 PM      Result Value Ref Range   Preg Test, Ur NEGATIVE  NEGATIVE   No results found.  Assessment and Plan: 28 y.o. female with Chlamydia exposure. Treatment with azithromycin (1000 mg by mouth) and ceftriaxone (250 mg  IM). Cytology, HIV and RPR pending.  Discussed warning signs or symptoms. Please see discharge instructions. Patient expresses understanding.   This note was created using Systems analyst. Any transcription errors are unintended.    Gregor Hams, MD 08/15/14 [phone]

## 2014-08-15 NOTE — ED Notes (Signed)
Patient has reported receiving a notification card that she has been exposed to chlamydia.  Patient denies any symptoms.

## 2014-08-15 NOTE — Discharge Instructions (Signed)
Thank you for coming in today. Chlamydia Chlamydia is an infection. It is spread through sexual contact. Chlamydia can be in different areas of the body. These areas include the cervix, urethra, throat, or rectum. You may not know you have chlamydia because many people never develop the symptoms. Chlamydia is not difficult to treat once you know you have it. However, if it is left untreated, chlamydia can lead to more serious health problems.  CAUSES  Chlamydia is caused by bacteria. It is a sexually transmitted disease. It is passed from an infected partner during intimate contact. This contact could be with the genitals, mouth, or rectal area. Chlamydia can also be passed from mothers to babies during birth. SIGNS AND SYMPTOMS  There may not be any symptoms. This is often the case early in the infection. If symptoms develop, they may include:  Mild pain and discomfort when urinating.  Redness, soreness, and swelling (inflammation) of the rectum.  Vaginal discharge.  Painful intercourse.  Abdominal pain.  Bleeding between menstrual periods. DIAGNOSIS  To diagnose this infection, your health care provider will do a pelvic exam. Cultures will be taken of the vagina, cervix, urine, and possibly the rectum to verify the diagnosis.  TREATMENT You will be given antibiotic medicines. If you are pregnant, certain types of antibiotics will need to be avoided. Any sexual partners should also be treated, even if they do not show symptoms.  HOME CARE INSTRUCTIONS   Take your antibiotic medicine as directed by your health care provider. Finish the antibiotic even if you start to feel better.  Take medicines only as directed by your health care provider.  Inform any sexual partners about the infection. They should also be treated.  Do not have sexual contact until your health care provider tells you it is okay.  Get plenty of rest.  Eat a well-balanced diet.  Drink enough fluids to keep your  urine clear or pale yellow.  Keep all follow-up visits as directed by your health care provider. SEEK MEDICAL CARE IF:  You have painful urination.  You have abdominal pain.  You have vaginal discharge.  You have painful sexual intercourse.  You have bleeding between periods and after sex.  You have a fever. SEEK IMMEDIATE MEDICAL CARE IF:   You experience nausea or vomiting.  You experience excessive sweating (diaphoresis).  You have difficulty swallowing. MAKE SURE YOU:   Understand these instructions.  Will watch your condition.  Will get help right away if you are not doing well or get worse. Document Released: 09/07/2005 Document Revised: 04/14/2014 Document Reviewed: 08/05/2013 [facility] Patient Information 2015 [place], [place]. This information is not intended to replace advice given to you by your health care provider. Make sure you discuss any questions you have with your health care provider.

## 2014-08-16 LAB — HIV ANTIBODY (ROUTINE TESTING W REFLEX): HIV 1&2 Ab, 4th Generation: NONREACTIVE

## 2014-08-16 LAB — RPR

## 2014-08-22 ENCOUNTER — Other Ambulatory Visit: Payer: Self-pay | Admitting: Emergency Medicine

## 2014-08-22 DIAGNOSIS — R102 Pelvic and perineal pain: Secondary | ICD-10-CM

## 2014-08-25 ENCOUNTER — Ambulatory Visit
Admission: RE | Admit: 2014-08-25 | Discharge: 2014-08-25 | Disposition: A | Discharge status: Home / Self Care | Payer: BC Managed Care – PPO | Source: Ambulatory Visit | Attending: Emergency Medicine | Admitting: Emergency Medicine

## 2014-08-25 DIAGNOSIS — R102 Pelvic and perineal pain: Secondary | ICD-10-CM

## 2015-09-04 IMAGING — US US PELVIS COMPLETE
1 series · 14 of 25 positions shown · non-contrast
Comparison: None

CLINICAL DATA: Adnexal tenderness.

EXAM:
TRANSABDOMINAL AND TRANSVAGINAL ULTRASOUND OF PELVIS
TECHNIQUE: Both transabdominal and transvaginal ultrasound examinations of the
pelvis were performed. Transabdominal technique was performed for
global imaging of the pelvis including uterus, ovaries, adnexal
regions, and pelvic cul-de-sac. It was necessary to proceed with
endovaginal exam following the transabdominal exam to visualize the
left ovary.

[Series 1: us pelvis complete · 0.20mm/px · 14 of 66 slices shown]
[im 1/66]
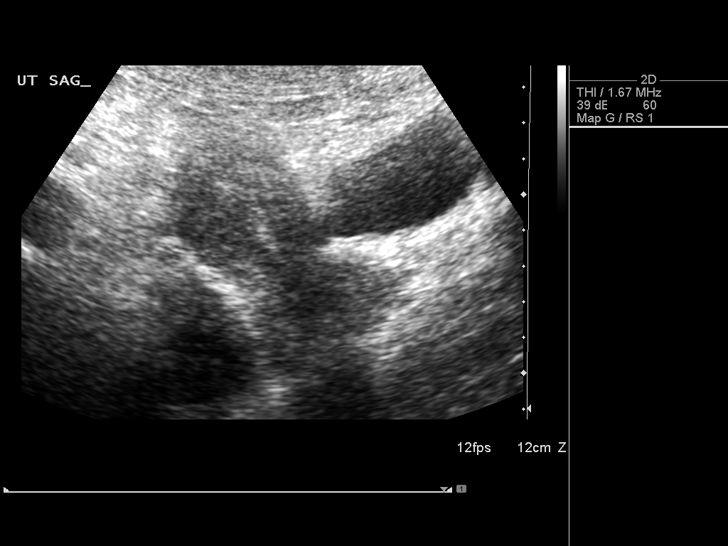
[im 6/66]
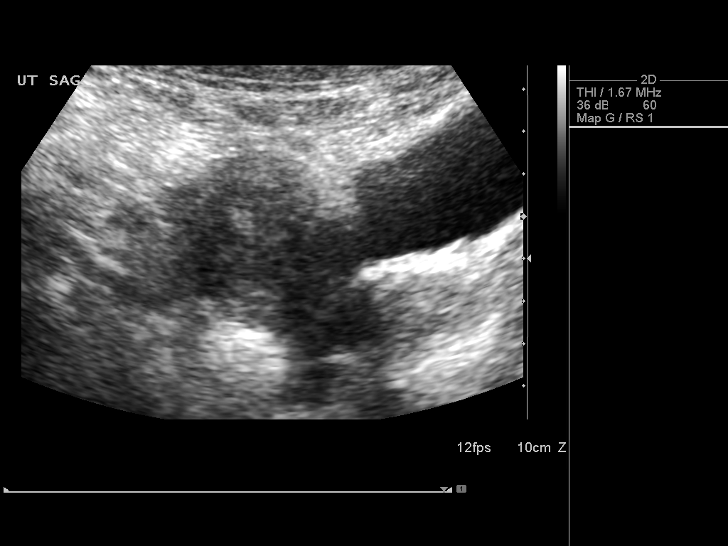
[im 11/66]
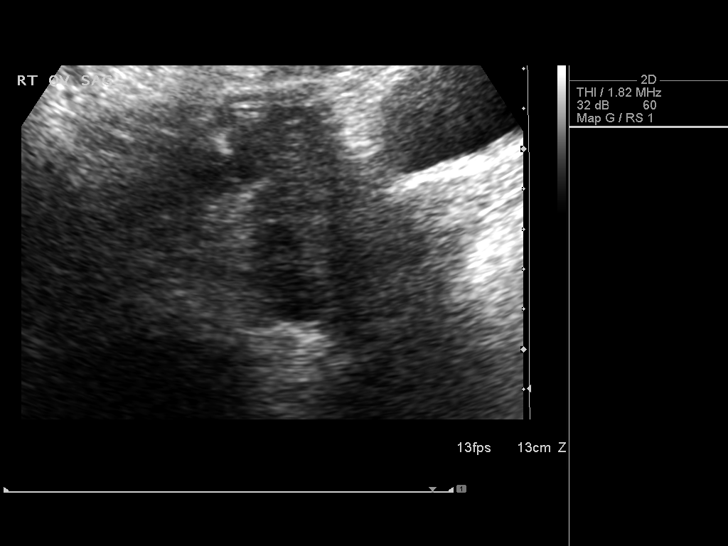
[im 17/66]
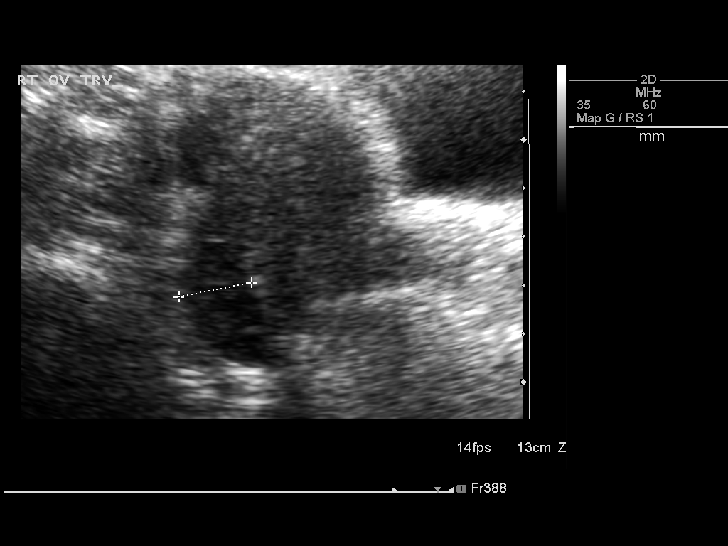
[im 22/66]
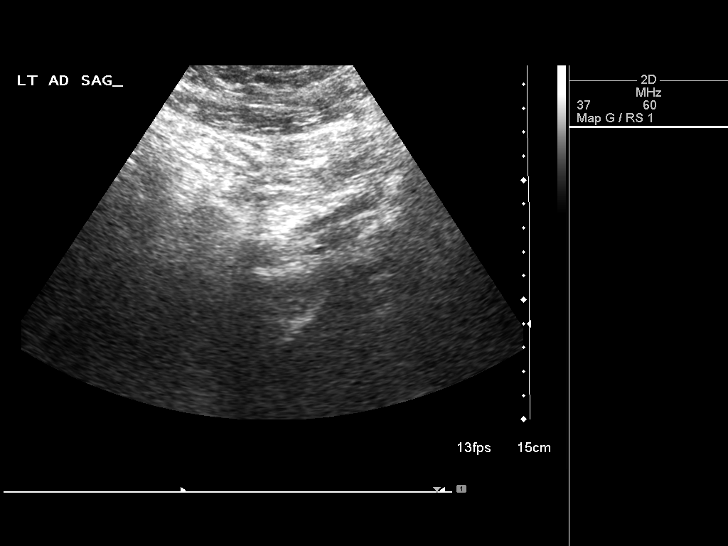
[im 25/66]
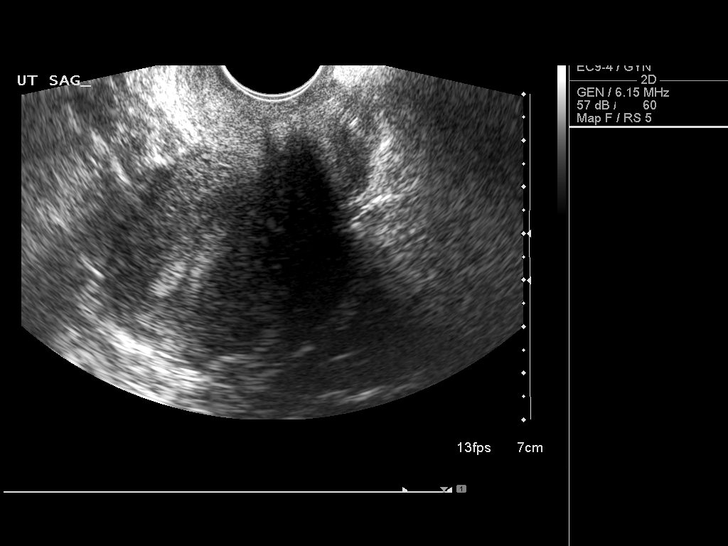
[im 30/66]
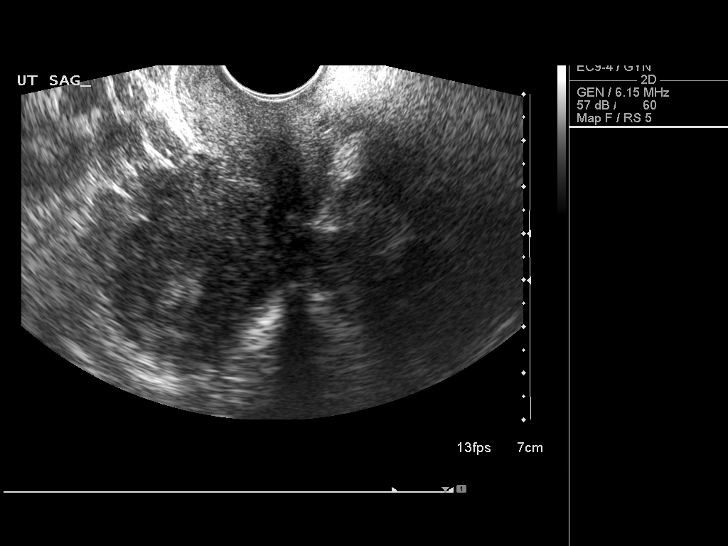
[im 36/66]
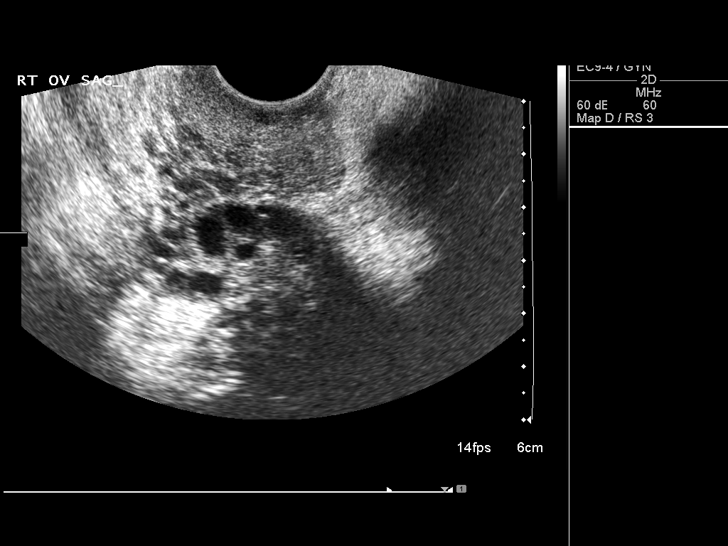
[im 41/66]
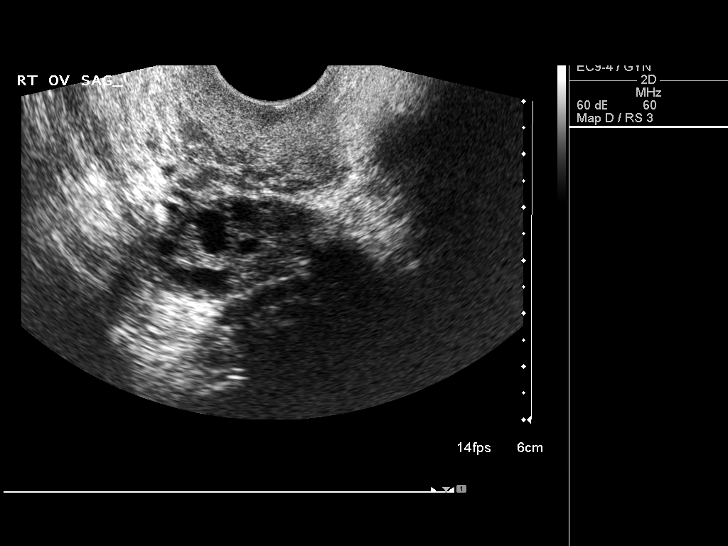
[im 44/66]
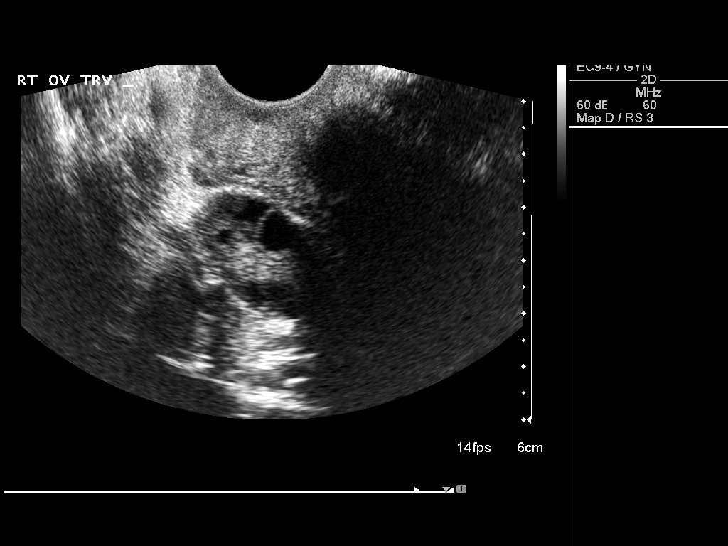
[im 49/66]
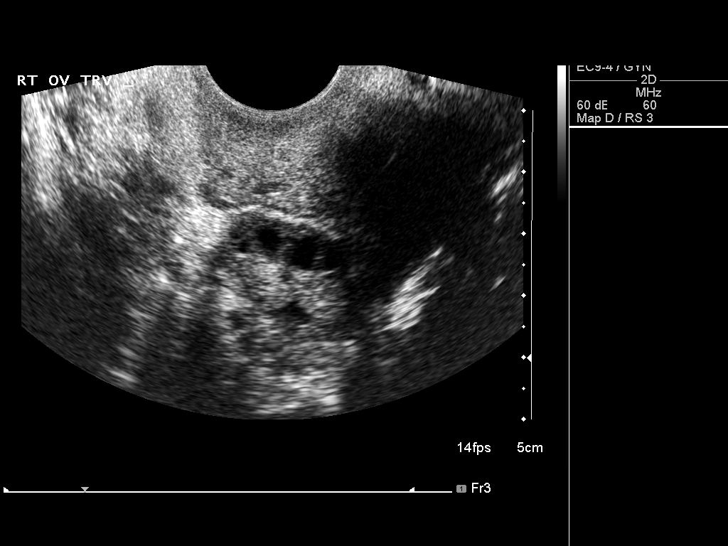
[im 55/66]
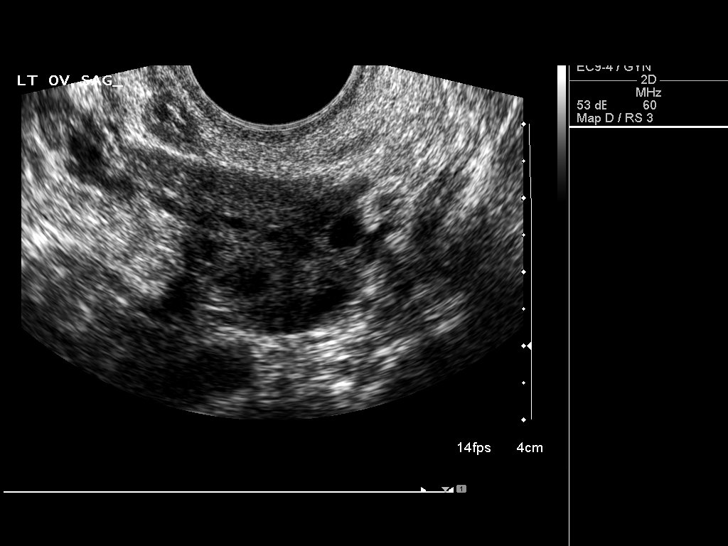
[im 60/66]
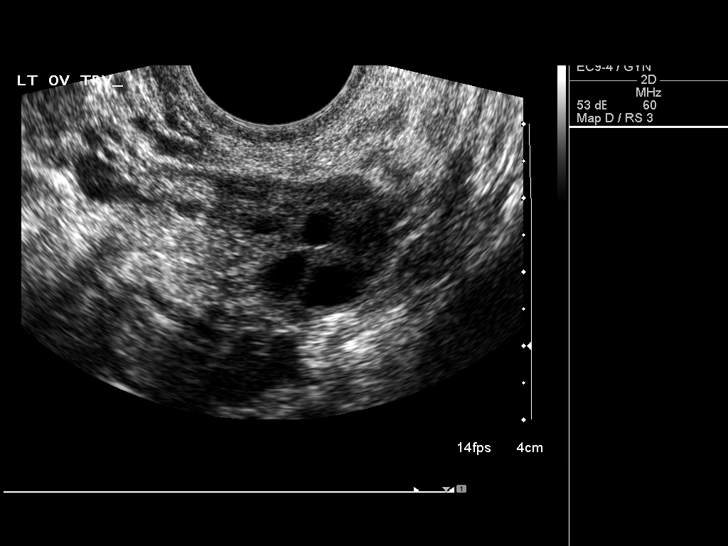
[im 66/66]
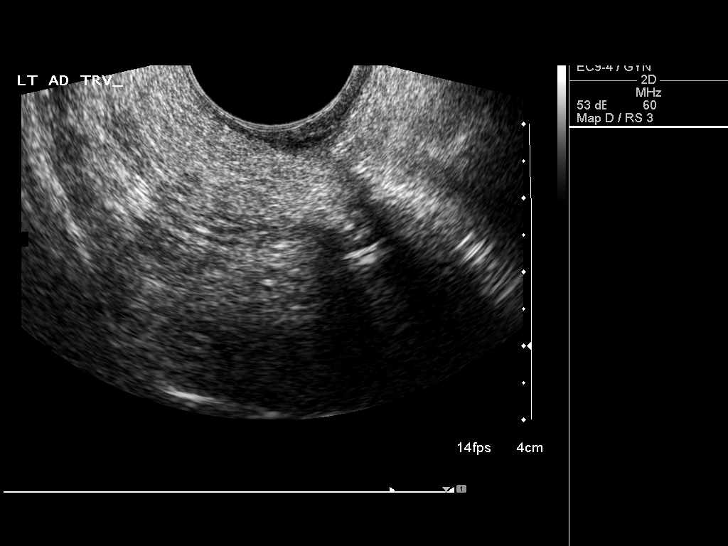

[14 of 25 positions shown; findings below may reference images not displayed]

FINDINGS: Uterus

Measurements: 7.0 x 5.0 x 3.9 cm. No fibroids or other mass
visualized.

Endometrium

Thickness: 6.2 mm.  No focal abnormality visualized.

Right ovary

Measurements: 3.8 x 2.1 x 1.9 cm. Normal appearance/no adnexal mass.

Left ovary

Measurements: 2.9 x 2.3 x 2.1 cm. Normal appearance/no adnexal mass.

Other findings

No free fluid.
IMPRESSION: No abnormality seen in the pelvis.

## 2016-09-02 ENCOUNTER — Ambulatory Visit (INDEPENDENT_AMBULATORY_CARE_PROVIDER_SITE_OTHER): Payer: Self-pay | Admitting: Nurse Practitioner

## 2016-09-02 ENCOUNTER — Other Ambulatory Visit (INDEPENDENT_AMBULATORY_CARE_PROVIDER_SITE_OTHER): Payer: Self-pay

## 2016-09-02 ENCOUNTER — Encounter: Payer: Self-pay | Admitting: Nurse Practitioner

## 2016-09-02 VITALS — BP 188/106 | HR 92 | Temp 98.8°F | Ht 63.0 in | Wt 205.0 lb

## 2016-09-02 DIAGNOSIS — Z803 Family history of malignant neoplasm of breast: Secondary | ICD-10-CM

## 2016-09-02 DIAGNOSIS — Z0000 Encounter for general adult medical examination without abnormal findings: Secondary | ICD-10-CM

## 2016-09-02 DIAGNOSIS — R6889 Other general symptoms and signs: Secondary | ICD-10-CM

## 2016-09-02 DIAGNOSIS — Z0001 Encounter for general adult medical examination with abnormal findings: Secondary | ICD-10-CM

## 2016-09-02 DIAGNOSIS — I10 Essential (primary) hypertension: Secondary | ICD-10-CM

## 2016-09-02 DIAGNOSIS — E669 Obesity, unspecified: Secondary | ICD-10-CM

## 2016-09-02 LAB — COMPREHENSIVE METABOLIC PANEL
ALBUMIN: 4.1 g/dL (ref 3.5–5.2)
ALK PHOS: 65 U/L (ref 39–117)
ALT: 14 U/L (ref 0–35)
AST: 14 U/L (ref 0–37)
BILIRUBIN TOTAL: 0.6 mg/dL (ref 0.2–1.2)
BUN: 8 mg/dL (ref 6–23)
CHLORIDE: 104 meq/L (ref 96–112)
CO2: 30 mEq/L (ref 19–32)
CREATININE: 0.63 mg/dL (ref 0.40–1.20)
Calcium: 9.1 mg/dL (ref 8.4–10.5)
GFR: 142.81 mL/min (ref >60.00)
Glucose, Bld: 82 mg/dL (ref 70–99)
Potassium: 3.4 mEq/L — ABNORMAL LOW (ref 3.5–5.1)
Sodium: 139 mEq/L (ref 135–145)
TOTAL PROTEIN: 8.2 g/dL (ref 6.0–8.3)

## 2016-09-02 LAB — CBC WITH DIFFERENTIAL/PLATELET
Basophils Absolute: 0 10*3/uL (ref 0.0–0.1)
Basophils Relative: 0.2 % (ref 0.0–3.0)
EOS PCT: 2 % (ref 0.0–5.0)
Eosinophils Absolute: 0.2 10*3/uL (ref 0.0–0.7)
HEMATOCRIT: 35 % — AB (ref 36.0–46.0)
Hemoglobin: 11.8 g/dL — ABNORMAL LOW (ref 12.0–15.0)
LYMPHS PCT: 24.7 % (ref 12.0–46.0)
Lymphs Abs: 2.8 10*3/uL (ref 0.7–4.0)
MCHC: 33.8 g/dL (ref 30.0–36.0)
MCV: 78.6 fl (ref 78.0–100.0)
MONO ABS: 0.6 10*3/uL (ref 0.1–1.0)
Monocytes Relative: 5.1 % (ref 3.0–12.0)
Neutro Abs: 7.6 10*3/uL (ref 1.4–7.7)
Neutrophils Relative %: 68 % (ref 43.0–77.0)
Platelets: 399 10*3/uL (ref 150.0–400.0)
RBC: 4.45 Mil/uL (ref 3.87–5.11)
RDW: 14.3 % (ref 11.5–15.5)
WBC: 11.1 10*3/uL — ABNORMAL HIGH (ref 4.0–10.5)

## 2016-09-02 LAB — LIPID PANEL
CHOLESTEROL: 122 mg/dL (ref 0–200)
HDL: 41 mg/dL (ref >39.00)
LDL CALC: 62 mg/dL (ref 0–99)
NonHDL: 81.04
Total CHOL/HDL Ratio: 3
Triglycerides: 97 mg/dL (ref 0.0–149.0)
VLDL: 19.4 mg/dL (ref 0.0–40.0)

## 2016-09-02 LAB — TSH: TSH: 1.57 u[IU]/mL (ref 0.35–4.50)

## 2016-09-02 MED ORDER — AMLODIPINE BESYLATE 10 MG PO TABS: 10.0000 mg | ORAL_TABLET | Freq: Every day | ORAL | 2 refills | Status: DC

## 2016-09-02 MED ORDER — LOSARTAN POTASSIUM 50 MG PO TABS: 50.0000 mg | ORAL_TABLET | Freq: Every day | ORAL | 2 refills | Status: DC

## 2016-09-02 MED ORDER — HYDROCHLOROTHIAZIDE 12.5 MG PO TABS: 12.5000 mg | ORAL_TABLET | Freq: Every day | ORAL | 2 refills | Status: DC

## 2016-09-02 NOTE — Assessment & Plan Note (Signed)
Advised about the need for healthy diet and regular exercise.

## 2016-09-02 NOTE — Progress Notes (Signed)
Subjective:    Patient ID: Cynthia Carpenter, female    DOB: 1985/12/20, 30 y.o.   MRN: [id number]  Patient presents today for complete physical or establish care (new patient) and HTN med refill.  HPI  HTN: Cynthia Carpenter has been out of blood pressure medication for over 2 months. Due to loss of insurance. None She denies any chest pain or palpitations, headaches, dizziness, edema, PND.  Immunizations: (TDAP, Hep C screen, Pneumovax, Influenza, zoster)  Health Maintenance  Topic Date Due  . Tetanus Vaccine  10/16/2005  . Flu Shot  03/11/2017*  . Pap Smear  08/12/2017*  . HIV Screening  Completed  *Topic was postponed. The date shown is not the original due date.   Diet:None Weight:  Wt Readings from Last 3 Encounters:  09/02/16 205 lb (93 kg)  01/08/13 194 lb (88 kg)  12/27/12 192 lb (87.1 kg)   Exercise:None Fall Risk: Fall Risk  09/02/2016  Falls in the past year? No   Depression/Suicide: Depression screen [facility] 2/9 09/02/2016  Decreased Interest 0  Down, Depressed, Hopeless 0  PHQ - 2 Score 0   No flowsheet data found. Pap Smear (every 20yrs for >21-29 without HPV, every 44yrs for >30-31yrs with HPV):Up-to-date, last done 2015 normal per patient, records requested from patient Mammogram (yearly, >63yrs): States she needs mammogram due to family history(mother diagnosed with breast cancer at the age of 58) Advanced Directive: Advanced Directives 09/02/2016  Does patient have an advance directive? No   Sexual History (birth control, marital status, STD): Married, sexually active with husband only, status post tubal ligation 2015, has 2 children(one boy and one girl).  Medications and allergies reviewed with patient and updated if appropriate.  Patient Active Problem List   Diagnosis Date Noted  . History of unprotected sex 01/08/2013  . FH: breast cancer in first degree relative 01/08/2013  . Obesity 12/27/2012  . HIDRADENITIS SUPPURATIVA 03/30/2010  . Hypertension  03/30/2010    Current Outpatient Prescriptions on File Prior to Visit  Medication Sig Dispense Refill  . OVER THE COUNTER MEDICATION hctz     No current facility-administered medications on file prior to visit.     Past Medical History:  Diagnosis Date  . Asthma   . Hypertension   . Obesity     Past Surgical History:  Procedure Laterality Date  . TUBAL LIGATION      Social History   Social History  . Marital status: Married    Spouse name: N/A  . Number of children: N/A  . Years of education: N/A   Social History Main Topics  . Smoking status: Never Smoker  . Smokeless tobacco: Never Used  . Alcohol use Yes     Comment: socially  . Drug use: No  . Sexual activity: Yes    Birth control/ protection: Surgical     Comment: tubal ligation11/04/2014   Other Topics Concern  . None   Social History Narrative  . None    Family History  Problem Relation Age of Onset  . Arthritis Mother   . Cancer Mother   . Hyperlipidemia Mother   . Hyperthyroidism Mother   . Alcohol abuse Father   . Hypertension Father   . Diabetes Brother   . Cancer Maternal Grandmother   . Hyperlipidemia Maternal Grandmother   . Hypertension Maternal Grandmother   . Depression Paternal Grandmother         Review of Systems  Constitutional: Negative for fever, malaise/fatigue and weight loss.  HENT: Negative for congestion and sore throat.   Eyes:       Negative for visual changes  Respiratory: Negative for cough and shortness of breath.   Cardiovascular: Negative for chest pain, palpitations and leg swelling.  Gastrointestinal: Negative for blood in stool, constipation, diarrhea and heartburn.  Genitourinary: Negative for dysuria, frequency and urgency.  Musculoskeletal: Negative for falls, joint pain and myalgias.  Skin: Negative for rash.  Neurological: Negative for dizziness, sensory change and headaches.  Endo/Heme/Allergies: Does not bruise/bleed easily.    Psychiatric/Behavioral: Negative for depression, substance abuse and suicidal ideas. The patient is not nervous/anxious.     Objective:   Vitals:   09/02/16 1517  BP: (!) 188/106  Pulse: 92  Temp: 98.8 F (37.1 C)    Body mass index is 36.31 kg/m.   Physical Examination:  Physical Exam  Constitutional: She is oriented to person, place, and time and well-developed, well-nourished, and in no distress. No distress.  HENT:  Right Ear: External ear normal.  Left Ear: External ear normal.  Nose: Nose normal.  Mouth/Throat: No oropharyngeal exudate.  Eyes: Conjunctivae and EOM are normal. Pupils are equal, round, and reactive to light. No scleral icterus.  Neck: Normal range of motion. Neck supple. No thyromegaly present.  Cardiovascular: Normal rate, regular rhythm, normal heart sounds and intact distal pulses.   Pulmonary/Chest: Effort normal and breath sounds normal. Right breast exhibits no mass, no nipple discharge, no skin change and no tenderness. Left breast exhibits no mass, no nipple discharge, no skin change and no tenderness. Breasts are symmetrical.  Abdominal: Soft. Bowel sounds are normal. She exhibits no distension. There is no tenderness.  Genitourinary: Rectum normal and vulva normal. Cervix exhibits no motion tenderness.  Genitourinary Comments: Deferred by patient due to current menstrual cycle.  Musculoskeletal: Normal range of motion. She exhibits no edema or tenderness.  Lymphadenopathy:    She has no cervical adenopathy.  Neurological: She is alert and oriented to person, place, and time. Gait normal.  Skin: Skin is warm and dry.  Psychiatric: Affect and judgment normal.  Vitals reviewed.   ASSESSMENT and PLAN:  Laquata was seen today for hypertension.  Diagnoses and all orders for this visit:  Encounter for preventative adult health care exam with abnormal findings -     MM Digital Screening; Future -     Comprehensive metabolic panel; Future -      Lipid Profile; Future -     TSH; Future -     CBC w/Diff; Future  Essential hypertension -     Comprehensive metabolic panel; Future -     amLODipine (NORVASC) 10 MG tablet; Take 1 tablet (10 mg total) by mouth daily. -     hydrochlorothiazide (HYDRODIURIL) 12.5 MG tablet; Take 1 tablet (12.5 mg total) by mouth daily. -     Discontinue: losartan (COZAAR) 50 MG tablet; Take 1 tablet (50 mg total) by mouth daily.  FH: breast cancer in first degree relative -     MM Digital Screening; Future  Obesity    Obesity Advised about the need for healthy diet and regular exercise.    Recent Results (from the past 2160 hour(s))  Comprehensive metabolic panel     Status: Abnormal   Collection Time: 09/02/16  4:11 PM  Result Value Ref Range   Sodium 139 135 - 145 mEq/L   Potassium 3.4 (L) 3.5 - 5.1 mEq/L   Chloride 104 96 - 112 mEq/L   CO2 30 19 -  32 mEq/L   Glucose, Bld 82 70 - 99 mg/dL   BUN 8 6 - 23 mg/dL   Creatinine, Ser 0.63 0.40 - 1.20 mg/dL   Total Bilirubin 0.6 0.2 - 1.2 mg/dL   Alkaline Phosphatase 65 39 - 117 U/L   AST 14 0 - 37 U/L   ALT 14 0 - 35 U/L   Total Protein 8.2 6.0 - 8.3 g/dL   Albumin 4.1 3.5 - 5.2 g/dL   Calcium 9.1 8.4 - 10.5 mg/dL   GFR 142.81 >60.00 mL/min  Lipid Profile     Status: None   Collection Time: 09/02/16  4:11 PM  Result Value Ref Range   Cholesterol 122 0 - 200 mg/dL    Comment: ATP III Classification       Desirable:  < 200 mg/dL               Borderline High:  200 - 239 mg/dL          High:  > = 240 mg/dL   Triglycerides 97.0 0.0 - 149.0 mg/dL    Comment: Normal:  <150 mg/dLBorderline High:  150 - 199 mg/dL   HDL 41.00 >39.00 mg/dL   VLDL 19.4 0.0 - 40.0 mg/dL   LDL Cholesterol 62 0 - 99 mg/dL   Total CHOL/HDL Ratio 3     Comment:                Men          Women1/2 Average Risk     3.4          3.3Average Risk          5.0          4.42X Average Risk          9.6          7.13X Average Risk          15.0          11.0                        NonHDL 81.04     Comment: NOTE:  Non-HDL goal should be 30 mg/dL higher than patient's LDL goal (i.e. LDL goal of < 70 mg/dL, would have non-HDL goal of < 100 mg/dL)  TSH     Status: None   Collection Time: 09/02/16  4:11 PM  Result Value Ref Range   TSH 1.57 0.35 - 4.50 uIU/mL  CBC w/Diff     Status: Abnormal   Collection Time: 09/02/16  4:11 PM  Result Value Ref Range   WBC 11.1 (H) 4.0 - 10.5 K/uL   RBC 4.45 3.87 - 5.11 Mil/uL   Hemoglobin 11.8 (L) 12.0 - 15.0 g/dL   HCT 35.0 (L) 36.0 - 46.0 %   MCV 78.6 78.0 - 100.0 fl   MCHC 33.8 30.0 - 36.0 g/dL   RDW 14.3 11.5 - 15.5 %   Platelets 399.0 150.0 - 400.0 K/uL   Neutrophils Relative % 68.0 43.0 - 77.0 %   Lymphocytes Relative 24.7 12.0 - 46.0 %   Monocytes Relative 5.1 3.0 - 12.0 %   Eosinophils Relative 2.0 0.0 - 5.0 %   Basophils Relative 0.2 0.0 - 3.0 %   Neutro Abs 7.6 1.4 - 7.7 K/uL   Lymphs Abs 2.8 0.7 - 4.0 K/uL   Monocytes Absolute 0.6 0.1 - 1.0 K/uL   Eosinophils Absolute 0.2 0.0 - 0.7 K/uL  Basophils Absolute 0.0 0.0 - 0.1 K/uL   Follow up: Return in about 3 months (around 12/02/2016) for HTN. call patient in one week to obtain blood pressure readings.  Wilfred Lacy, NP

## 2016-09-02 NOTE — Progress Notes (Signed)
Normal results, see office note

## 2016-09-02 NOTE — Progress Notes (Signed)
Pre visit review using our clinic review tool, if applicable. No additional management support is needed unless otherwise documented below in the visit note. 

## 2016-09-02 NOTE — Patient Instructions (Signed)
Check BP once a day and record Bring BP records to next office visit.  Preventive Care for Adults, Female A healthy lifestyle and preventive care can promote health and wellness. Preventive health guidelines for women include the following key practices.  A routine yearly physical is a good way to check with your health care provider about your health and preventive screening. It is a chance to share any concerns and updates on your health and to receive a thorough exam.  Visit your dentist for a routine exam and preventive care every 6 months. Brush your teeth twice a day and floss once a day. Good oral hygiene prevents tooth decay and gum disease.  The frequency of eye exams is based on your age, health, family medical history, use of contact lenses, and other factors. Follow your health care provider's recommendations for frequency of eye exams.  Eat a healthy diet. Foods like vegetables, fruits, whole grains, low-fat dairy products, and lean protein foods contain the nutrients you need without too many calories. Decrease your intake of foods high in solid fats, added sugars, and salt. Eat the right amount of calories for you.Get information about a proper diet from your health care provider, if necessary.  Regular physical exercise is one of the most important things you can do for your health. Most adults should get at least 150 minutes of moderate-intensity exercise (any activity that increases your heart rate and causes you to sweat) each week. In addition, most adults need muscle-strengthening exercises on 2 or more days a week.  Maintain a healthy weight. The body mass index (BMI) is a screening tool to identify possible weight problems. It provides an estimate of body fat based on height and weight. Your health care provider can find your BMI and can help you achieve or maintain a healthy weight.For adults 20 years and older:  A BMI below 18.5 is considered underweight.  A BMI of 18.5  to 24.9 is normal.  A BMI of 25 to 29.9 is considered overweight.  A BMI of 30 and above is considered obese.  Maintain normal blood lipids and cholesterol levels by exercising and minimizing your intake of saturated fat. Eat a balanced diet with plenty of fruit and vegetables. Blood tests for lipids and cholesterol should begin at age 65 and be repeated every 5 years. If your lipid or cholesterol levels are high, you are over 50, or you are at high risk for heart disease, you may need your cholesterol levels checked more frequently.Ongoing high lipid and cholesterol levels should be treated with medicines if diet and exercise are not working.  If you smoke, find out from your health care provider how to quit. If you do not use tobacco, do not start.  Lung cancer screening is recommended for adults aged 52-80 years who are at high risk for developing lung cancer because of a history of smoking. A yearly low-dose CT scan of the lungs is recommended for people who have at least a 30-pack-year history of smoking and are a current smoker or have quit within the past 15 years. A pack year of smoking is smoking an average of 1 pack of cigarettes a day for 1 year (for example: 1 pack a day for 30 years or 2 packs a day for 15 years). Yearly screening should continue until the smoker has stopped smoking for at least 15 years. Yearly screening should be stopped for people who develop a health problem that would prevent them from having  lung cancer treatment.  If you are pregnant, do not drink alcohol. If you are breastfeeding, be very cautious about drinking alcohol. If you are not pregnant and choose to drink alcohol, do not have more than 1 drink per day. One drink is considered to be 12 ounces (355 mL) of beer, 5 ounces (148 mL) of wine, or 1.5 ounces (44 mL) of liquor.  Avoid use of street drugs. Do not share needles with anyone. Ask for help if you need support or instructions about stopping the use of  drugs.  High blood pressure causes heart disease and increases the risk of stroke. Your blood pressure should be checked at least every 1 to 2 years. Ongoing high blood pressure should be treated with medicines if weight loss and exercise do not work.  If you are 35-97 years old, ask your health care provider if you should take aspirin to prevent strokes.  Diabetes screening is done by taking a blood sample to check your blood glucose level after you have not eaten for a certain period of time (fasting). If you are not overweight and you do not have risk factors for diabetes, you should be screened once every 3 years starting at age 72. If you are overweight or obese and you are 49-72 years of age, you should be screened for diabetes every year as part of your cardiovascular risk assessment.  Breast cancer screening is essential preventive care for women. You should practice "breast self-awareness." This means understanding the normal appearance and feel of your breasts and may include breast self-examination. Any changes detected, no matter how small, should be reported to a health care provider. Women in their 14s and 30s should have a clinical breast exam (CBE) by a health care provider as part of a regular health exam every 1 to 3 years. After age 51, women should have a CBE every year. Starting at age 42, women should consider having a mammogram (breast X-ray test) every year. Women who have a family history of breast cancer should talk to their health care provider about genetic screening. Women at a high risk of breast cancer should talk to their health care providers about having an MRI and a mammogram every year.  Breast cancer gene (BRCA)-related cancer risk assessment is recommended for women who have family members with BRCA-related cancers. BRCA-related cancers include breast, ovarian, tubal, and peritoneal cancers. Having family members with these cancers may be associated with an increased  risk for harmful changes (mutations) in the breast cancer genes BRCA1 and BRCA2. Results of the assessment will determine the need for genetic counseling and BRCA1 and BRCA2 testing.  Your health care provider may recommend that you be screened regularly for cancer of the pelvic organs (ovaries, uterus, and vagina). This screening involves a pelvic examination, including checking for microscopic changes to the surface of your cervix (Pap test). You may be encouraged to have this screening done every 3 years, beginning at age 38.  For women ages 58-65, health care providers may recommend pelvic exams and Pap testing every 3 years, or they may recommend the Pap and pelvic exam, combined with testing for human papilloma virus (HPV), every 5 years. Some types of HPV increase your risk of cervical cancer. Testing for HPV may also be done on women of any age with unclear Pap test results.  Other health care providers may not recommend any screening for nonpregnant women who are considered low risk for pelvic cancer and who do not  have symptoms. Ask your health care provider if a screening pelvic exam is right for you.  If you have had past treatment for cervical cancer or a condition that could lead to cancer, you need Pap tests and screening for cancer for at least 20 years after your treatment. If Pap tests have been discontinued, your risk factors (such as having a new sexual partner) need to be reassessed to determine if screening should resume. Some women have medical problems that increase the chance of getting cervical cancer. In these cases, your health care provider may recommend more frequent screening and Pap tests.  Colorectal cancer can be detected and often prevented. Most routine colorectal cancer screening begins at the age of 30 years and continues through age 54 years. However, your health care provider may recommend screening at an earlier age if you have risk factors for colon cancer. On a  yearly basis, your health care provider may provide home test kits to check for hidden blood in the stool. Use of a small camera at the end of a tube, to directly examine the colon (sigmoidoscopy or colonoscopy), can detect the earliest forms of colorectal cancer. Talk to your health care provider about this at age 61, when routine screening begins. Direct exam of the colon should be repeated every 5-10 years through age 24 years, unless early forms of precancerous polyps or small growths are found.  People who are at an increased risk for hepatitis B should be screened for this virus. You are considered at high risk for hepatitis B if:  You were born in a country where hepatitis B occurs often. Talk with your health care provider about which countries are considered high risk.  Your parents were born in a high-risk country and you have not received a shot to protect against hepatitis B (hepatitis B vaccine).  You have HIV or AIDS.  You use needles to inject street drugs.  You live with, or have sex with, someone who has hepatitis B.  You get hemodialysis treatment.  You take certain medicines for conditions like cancer, organ transplantation, and autoimmune conditions.  Hepatitis C blood testing is recommended for all people born from [birth year] through 1965 and any individual with known risks for hepatitis C.  Practice safe sex. Use condoms and avoid high-risk sexual practices to reduce the spread of sexually transmitted infections (STIs). STIs include gonorrhea, chlamydia, syphilis, trichomonas, herpes, HPV, and human immunodeficiency virus (HIV). Herpes, HIV, and HPV are viral illnesses that have no cure. They can result in disability, cancer, and death.  You should be screened for sexually transmitted illnesses (STIs) including gonorrhea and chlamydia if:  You are sexually active and are younger than 24 years.  You are older than 24 years and your health care provider tells you that you are  at risk for this type of infection.  Your sexual activity has changed since you were last screened and you are at an increased risk for chlamydia or gonorrhea. Ask your health care provider if you are at risk.  If you are at risk of being infected with HIV, it is recommended that you take a prescription medicine daily to prevent HIV infection. This is called preexposure prophylaxis (PrEP). You are considered at risk if:  You are sexually active and do not regularly use condoms or know the HIV status of your partner(s).  You take drugs by injection.  You are sexually active with a partner who has HIV.  Talk with your health  care provider about whether you are at high risk of being infected with HIV. If you choose to begin PrEP, you should first be tested for HIV. You should then be tested every 3 months for as long as you are taking PrEP.  Osteoporosis is a disease in which the bones lose minerals and strength with aging. This can result in serious bone fractures or breaks. The risk of osteoporosis can be identified using a bone density scan. Women ages 17 years and over and women at risk for fractures or osteoporosis should discuss screening with their health care providers. Ask your health care provider whether you should take a calcium supplement or vitamin D to reduce the rate of osteoporosis.  Menopause can be associated with physical symptoms and risks. Hormone replacement therapy is available to decrease symptoms and risks. You should talk to your health care provider about whether hormone replacement therapy is right for you.  Use sunscreen. Apply sunscreen liberally and repeatedly throughout the day. You should seek shade when your shadow is shorter than you. Protect yourself by wearing long sleeves, pants, a wide-brimmed hat, and sunglasses year round, whenever you are outdoors.  Once a month, do a whole body skin exam, using a mirror to look at the skin on your back. Tell your health  care provider of new moles, moles that have irregular borders, moles that are larger than a pencil eraser, or moles that have changed in shape or color.  Stay current with required vaccines (immunizations).  Influenza vaccine. All adults should be immunized every year.  Tetanus, diphtheria, and acellular pertussis (Td, Tdap) vaccine. Pregnant women should receive 1 dose of Tdap vaccine during each pregnancy. The dose should be obtained regardless of the length of time since the last dose. Immunization is preferred during the 27th-36th week of gestation. An adult who has not previously received Tdap or who does not know her vaccine status should receive 1 dose of Tdap. This initial dose should be followed by tetanus and diphtheria toxoids (Td) booster doses every 10 years. Adults with an unknown or incomplete history of completing a 3-dose immunization series with Td-containing vaccines should begin or complete a primary immunization series including a Tdap dose. Adults should receive a Td booster every 10 years.  Varicella vaccine. An adult without evidence of immunity to varicella should receive 2 doses or a second dose if she has previously received 1 dose. Pregnant females who do not have evidence of immunity should receive the first dose after pregnancy. This first dose should be obtained before leaving the health care facility. The second dose should be obtained 4-8 weeks after the first dose.  Human papillomavirus (HPV) vaccine. Females aged 13-26 years who have not received the vaccine previously should obtain the 3-dose series. The vaccine is not recommended for use in pregnant females. However, pregnancy testing is not needed before receiving a dose. If a female is found to be pregnant after receiving a dose, no treatment is needed. In that case, the remaining doses should be delayed until after the pregnancy. Immunization is recommended for any person with an immunocompromised condition through  the age of 26 years if she did not get any or all doses earlier. During the 3-dose series, the second dose should be obtained 4-8 weeks after the first dose. The third dose should be obtained 24 weeks after the first dose and 16 weeks after the second dose.  Zoster vaccine. One dose is recommended for adults aged 60 years or  older unless certain conditions are present.  Measles, mumps, and rubella (MMR) vaccine. Adults born before [birth year] generally are considered immune to measles and mumps. Adults born in [birth year] or later should have 1 or more doses of MMR vaccine unless there is a contraindication to the vaccine or there is laboratory evidence of immunity to each of the three diseases. A routine second dose of MMR vaccine should be obtained at least 28 days after the first dose for students attending postsecondary schools, health care workers, or international travelers. People who received inactivated measles vaccine or an unknown type of measles vaccine during 1963-1967 should receive 2 doses of MMR vaccine. People who received inactivated mumps vaccine or an unknown type of mumps vaccine before 1979 and are at high risk for mumps infection should consider immunization with 2 doses of MMR vaccine. For females of childbearing age, rubella immunity should be determined. If there is no evidence of immunity, females who are not pregnant should be vaccinated. If there is no evidence of immunity, females who are pregnant should delay immunization until after pregnancy. Unvaccinated health care workers born before [birth year] who lack laboratory evidence of measles, mumps, or rubella immunity or laboratory confirmation of disease should consider measles and mumps immunization with 2 doses of MMR vaccine or rubella immunization with 1 dose of MMR vaccine.  Pneumococcal 13-valent conjugate (PCV13) vaccine. When indicated, a person who is uncertain of his immunization history and has no record of immunization should receive the  PCV13 vaccine. All adults 81 years of age and older should receive this vaccine. An adult aged 44 years or older who has certain medical conditions and has not been previously immunized should receive 1 dose of PCV13 vaccine. This PCV13 should be followed with a dose of pneumococcal polysaccharide (PPSV23) vaccine. Adults who are at high risk for pneumococcal disease should obtain the PPSV23 vaccine at least 8 weeks after the dose of PCV13 vaccine. Adults older than 31 years of age who have normal immune system function should obtain the PPSV23 vaccine dose at least 1 year after the dose of PCV13 vaccine.  Pneumococcal polysaccharide (PPSV23) vaccine. When PCV13 is also indicated, PCV13 should be obtained first. All adults aged 82 years and older should be immunized. An adult younger than age 8 years who has certain medical conditions should be immunized. Any person who resides in a nursing home or long-term care facility should be immunized. An adult smoker should be immunized. People with an immunocompromised condition and certain other conditions should receive both PCV13 and PPSV23 vaccines. People with human immunodeficiency virus (HIV) infection should be immunized as soon as possible after diagnosis. Immunization during chemotherapy or radiation therapy should be avoided. Routine use of PPSV23 vaccine is not recommended for American Indians, [place] Natives, or people younger than 65 years unless there are medical conditions that require PPSV23 vaccine. When indicated, people who have unknown immunization and have no record of immunization should receive PPSV23 vaccine. One-time revaccination 5 years after the first dose of PPSV23 is recommended for people aged 19-64 years who have chronic kidney failure, nephrotic syndrome, asplenia, or immunocompromised conditions. People who received 1-2 doses of PPSV23 before age 56 years should receive another dose of PPSV23 vaccine at age 13 years or later if at least  5 years have passed since the previous dose. Doses of PPSV23 are not needed for people immunized with PPSV23 at or after age 1 years.  Meningococcal vaccine. Adults with asplenia or persistent complement component deficiencies  should receive 2 doses of quadrivalent meningococcal conjugate (MenACWY-D) vaccine. The doses should be obtained at least 2 months apart. Microbiologists working with certain meningococcal bacteria, [employer] recruits, people at risk during an outbreak, and people who travel to or live in countries with a high rate of meningitis should be immunized. A first-year college student up through age 22 years who is living in a residence hall should receive a dose if she did not receive a dose on or after her 16th birthday. Adults who have certain high-risk conditions should receive one or more doses of vaccine.  Hepatitis A vaccine. Adults who wish to be protected from this disease, have certain high-risk conditions, work with hepatitis A-infected animals, work in hepatitis A research labs, or travel to or work in countries with a high rate of hepatitis A should be immunized. Adults who were previously unvaccinated and who anticipate close contact with an international adoptee during the first 60 days after arrival in the [place] from a country with a high rate of hepatitis A should be immunized.  Hepatitis B vaccine. Adults who wish to be protected from this disease, have certain high-risk conditions, may be exposed to blood or other infectious body fluids, are household contacts or sex partners of hepatitis B positive people, are clients or workers in certain care facilities, or travel to or work in countries with a high rate of hepatitis B should be immunized.  Haemophilus influenzae type b (Hib) vaccine. A previously unvaccinated person with asplenia or sickle cell disease or having a scheduled splenectomy should receive 1 dose of Hib vaccine. Regardless of previous immunization, a  recipient of a hematopoietic stem cell transplant should receive a 3-dose series 6-12 months after her successful transplant. Hib vaccine is not recommended for adults with HIV infection. Preventive Services / Frequency Ages 49 to 66 years  Blood pressure check.** / Every 3-5 years.  Lipid and cholesterol check.** / Every 5 years beginning at age 47.  Clinical breast exam.** / Every 3 years for women in their 47s and 75s.  BRCA-related cancer risk assessment.** / For women who have family members with a BRCA-related cancer (breast, ovarian, tubal, or peritoneal cancers).  Pap test.** / Every 2 years from ages 54 through 77. Every 3 years starting at age 60 through age 13 or 30 with a history of 3 consecutive normal Pap tests.  HPV screening.** / Every 3 years from ages 20 through ages 39 to 58 with a history of 3 consecutive normal Pap tests.  Hepatitis C blood test.** / For any individual with known risks for hepatitis C.  Skin self-exam. / Monthly.  Influenza vaccine. / Every year.  Tetanus, diphtheria, and acellular pertussis (Tdap, Td) vaccine.** / Consult your health care provider. Pregnant women should receive 1 dose of Tdap vaccine during each pregnancy. 1 dose of Td every 10 years.  Varicella vaccine.** / Consult your health care provider. Pregnant females who do not have evidence of immunity should receive the first dose after pregnancy.  HPV vaccine. / 3 doses over 6 months, if 8 and younger. The vaccine is not recommended for use in pregnant females. However, pregnancy testing is not needed before receiving a dose.  Measles, mumps, rubella (MMR) vaccine.** / You need at least 1 dose of MMR if you were born in 1957 or later. You may also need a 2nd dose. For females of childbearing age, rubella immunity should be determined. If there is no evidence of immunity, females who are  not pregnant should be vaccinated. If there is no evidence of immunity, females who are pregnant  should delay immunization until after pregnancy.  Pneumococcal 13-valent conjugate (PCV13) vaccine.** / Consult your health care provider.  Pneumococcal polysaccharide (PPSV23) vaccine.** / 1 to 2 doses if you smoke cigarettes or if you have certain conditions.  Meningococcal vaccine.** / 1 dose if you are age 43 to 94 years and a Market researcher living in a residence hall, or have one of several medical conditions, you need to get vaccinated against meningococcal disease. You may also need additional booster doses.  Hepatitis A vaccine.** / Consult your health care provider.  Hepatitis B vaccine.** / Consult your health care provider.  Haemophilus influenzae type b (Hib) vaccine.** / Consult your health care provider. Ages 47 to 104 years  Blood pressure check.** / Every year.  Lipid and cholesterol check.** / Every 5 years beginning at age 51 years.  Lung cancer screening. / Every year if you are aged 75-80 years and have a 30-pack-year history of smoking and currently smoke or have quit within the past 15 years. Yearly screening is stopped once you have quit smoking for at least 15 years or develop a health problem that would prevent you from having lung cancer treatment.  Clinical breast exam.** / Every year after age 56 years.  BRCA-related cancer risk assessment.** / For women who have family members with a BRCA-related cancer (breast, ovarian, tubal, or peritoneal cancers).  Mammogram.** / Every year beginning at age 6 years and continuing for as long as you are in good health. Consult with your health care provider.  Pap test.** / Every 3 years starting at age 2 years through age 67 or 59 years with a history of 3 consecutive normal Pap tests.  HPV screening.** / Every 3 years from ages 73 years through ages 80 to 59 years with a history of 3 consecutive normal Pap tests.  Fecal occult blood test (FOBT) of stool. / Every year beginning at age 21 years and  continuing until age 59 years. You may not need to do this test if you get a colonoscopy every 10 years.  Flexible sigmoidoscopy or colonoscopy.** / Every 5 years for a flexible sigmoidoscopy or every 10 years for a colonoscopy beginning at age 75 years and continuing until age 69 years.  Hepatitis C blood test.** / For all people born from [birth year] through 1965 and any individual with known risks for hepatitis C.  Skin self-exam. / Monthly.  Influenza vaccine. / Every year.  Tetanus, diphtheria, and acellular pertussis (Tdap/Td) vaccine.** / Consult your health care provider. Pregnant women should receive 1 dose of Tdap vaccine during each pregnancy. 1 dose of Td every 10 years.  Varicella vaccine.** / Consult your health care provider. Pregnant females who do not have evidence of immunity should receive the first dose after pregnancy.  Zoster vaccine.** / 1 dose for adults aged 50 years or older.  Measles, mumps, rubella (MMR) vaccine.** / You need at least 1 dose of MMR if you were born in 1957 or later. You may also need a second dose. For females of childbearing age, rubella immunity should be determined. If there is no evidence of immunity, females who are not pregnant should be vaccinated. If there is no evidence of immunity, females who are pregnant should delay immunization until after pregnancy.  Pneumococcal 13-valent conjugate (PCV13) vaccine.** / Consult your health care provider.  Pneumococcal polysaccharide (PPSV23) vaccine.** / 1 to 2  doses if you smoke cigarettes or if you have certain conditions.  Meningococcal vaccine.** / Consult your health care provider.  Hepatitis A vaccine.** / Consult your health care provider.  Hepatitis B vaccine.** / Consult your health care provider.  Haemophilus influenzae type b (Hib) vaccine.** / Consult your health care provider. Ages 2 years and over  Blood pressure check.** / Every year.  Lipid and cholesterol check.** / Every 5 years  beginning at age 78 years.  Lung cancer screening. / Every year if you are aged 49-80 years and have a 30-pack-year history of smoking and currently smoke or have quit within the past 15 years. Yearly screening is stopped once you have quit smoking for at least 15 years or develop a health problem that would prevent you from having lung cancer treatment.  Clinical breast exam.** / Every year after age 104 years.  BRCA-related cancer risk assessment.** / For women who have family members with a BRCA-related cancer (breast, ovarian, tubal, or peritoneal cancers).  Mammogram.** / Every year beginning at age 60 years and continuing for as long as you are in good health. Consult with your health care provider.  Pap test.** / Every 3 years starting at age 35 years through age 51 or 49 years with 3 consecutive normal Pap tests. Testing can be stopped between 65 and 70 years with 3 consecutive normal Pap tests and no abnormal Pap or HPV tests in the past 10 years.  HPV screening.** / Every 3 years from ages 35 years through ages 47 or 32 years with a history of 3 consecutive normal Pap tests. Testing can be stopped between 65 and 70 years with 3 consecutive normal Pap tests and no abnormal Pap or HPV tests in the past 10 years.  Fecal occult blood test (FOBT) of stool. / Every year beginning at age 10 years and continuing until age 31 years. You may not need to do this test if you get a colonoscopy every 10 years.  Flexible sigmoidoscopy or colonoscopy.** / Every 5 years for a flexible sigmoidoscopy or every 10 years for a colonoscopy beginning at age 70 years and continuing until age 71 years.  Hepatitis C blood test.** / For all people born from [birth year] through 1965 and any individual with known risks for hepatitis C.  Osteoporosis screening.** / A one-time screening for women ages 36 years and over and women at risk for fractures or osteoporosis.  Skin self-exam. / Monthly.  Influenza vaccine. /  Every year.  Tetanus, diphtheria, and acellular pertussis (Tdap/Td) vaccine.** / 1 dose of Td every 10 years.  Varicella vaccine.** / Consult your health care provider.  Zoster vaccine.** / 1 dose for adults aged 44 years or older.  Pneumococcal 13-valent conjugate (PCV13) vaccine.** / Consult your health care provider.  Pneumococcal polysaccharide (PPSV23) vaccine.** / 1 dose for all adults aged 30 years and older.  Meningococcal vaccine.** / Consult your health care provider.  Hepatitis A vaccine.** / Consult your health care provider.  Hepatitis B vaccine.** / Consult your health care provider.  Haemophilus influenzae type b (Hib) vaccine.** / Consult your health care provider. ** Family history and personal history of risk and conditions may change your health care provider's recommendations.   This information is not intended to replace advice given to you by your health care provider. Make sure you discuss any questions you have with your health care provider.   Document Released: 01/24/2002 Document Revised: 12/19/2014 Document Reviewed: 04/25/2011 Elsevier Interactive Patient Education  2016 [employer].

## 2016-09-05 ENCOUNTER — Other Ambulatory Visit: Payer: Self-pay | Admitting: Nurse Practitioner

## 2016-09-05 DIAGNOSIS — Z803 Family history of malignant neoplasm of breast: Secondary | ICD-10-CM

## 2016-09-05 DIAGNOSIS — Z1231 Encounter for screening mammogram for malignant neoplasm of breast: Secondary | ICD-10-CM

## 2016-09-06 ENCOUNTER — Encounter: Payer: Self-pay | Admitting: Nurse Practitioner

## 2016-09-16 ENCOUNTER — Encounter: Payer: Self-pay | Admitting: Nurse Practitioner

## 2016-10-24 ENCOUNTER — Other Ambulatory Visit: Payer: Self-pay | Admitting: Nurse Practitioner

## 2016-10-24 DIAGNOSIS — Z803 Family history of malignant neoplasm of breast: Secondary | ICD-10-CM

## 2016-10-24 DIAGNOSIS — Z0001 Encounter for general adult medical examination with abnormal findings: Secondary | ICD-10-CM

## 2016-10-24 DIAGNOSIS — Z1231 Encounter for screening mammogram for malignant neoplasm of breast: Secondary | ICD-10-CM

## 2016-10-28 ENCOUNTER — Ambulatory Visit
Admission: RE | Admit: 2016-10-28 | Discharge: 2016-10-28 | Disposition: A | Discharge status: Home / Self Care | Payer: No Typology Code available for payment source | Source: Ambulatory Visit | Attending: Nurse Practitioner | Admitting: Nurse Practitioner

## 2016-10-28 DIAGNOSIS — Z1231 Encounter for screening mammogram for malignant neoplasm of breast: Secondary | ICD-10-CM

## 2016-10-28 DIAGNOSIS — Z803 Family history of malignant neoplasm of breast: Secondary | ICD-10-CM

## 2016-10-28 DIAGNOSIS — Z0001 Encounter for general adult medical examination with abnormal findings: Secondary | ICD-10-CM

## 2016-11-18 ENCOUNTER — Ambulatory Visit: Payer: Self-pay | Admitting: Nurse Practitioner

## 2016-11-22 ENCOUNTER — Encounter: Payer: Self-pay | Admitting: Nurse Practitioner

## 2016-11-22 ENCOUNTER — Telehealth: Payer: Self-pay | Admitting: Nurse Practitioner

## 2016-11-22 ENCOUNTER — Ambulatory Visit (INDEPENDENT_AMBULATORY_CARE_PROVIDER_SITE_OTHER): Payer: Self-pay | Admitting: Nurse Practitioner

## 2016-11-22 VITALS — BP 136/98 | HR 97 | Temp 99.3°F | Ht 63.0 in | Wt 196.0 lb

## 2016-11-22 DIAGNOSIS — J069 Acute upper respiratory infection, unspecified: Secondary | ICD-10-CM

## 2016-11-22 LAB — POCT INFLUENZA A/B
INFLUENZA B, POC: NEGATIVE
Influenza A, POC: NEGATIVE

## 2016-11-22 MED ORDER — SALINE SPRAY 0.65 % NA SOLN: 1.0000 | NASAL | 0 refills | Status: AC | PRN

## 2016-11-22 MED ORDER — DM-GUAIFENESIN ER 30-600 MG PO TB12: 1.0000 | ORAL_TABLET | Freq: Two times a day (BID) | ORAL | 0 refills | Status: DC | PRN

## 2016-11-22 MED ORDER — METHYLPREDNISOLONE ACETATE 40 MG/ML IJ SUSP
40.0000 mg | Freq: Once | INTRAMUSCULAR | Status: AC
  Administered 2016-11-22: 40 mg via INTRAMUSCULAR

## 2016-11-22 MED ORDER — FLUTICASONE PROPIONATE 50 MCG/ACT NA SUSP: 2.0000 | Freq: Every day | NASAL | 0 refills | Status: DC

## 2016-11-22 MED ORDER — BENZONATATE 100 MG PO CAPS: 100.0000 mg | ORAL_CAPSULE | Freq: Three times a day (TID) | ORAL | 0 refills | Status: DC | PRN

## 2016-11-22 MED ORDER — OXYMETAZOLINE HCL 0.05 % NA SOLN: 1.0000 | Freq: Two times a day (BID) | NASAL | 0 refills | Status: DC

## 2016-11-22 NOTE — Progress Notes (Signed)
Pre visit review using our clinic review tool, if applicable. No additional management support is needed unless otherwise documented below in the visit note. 

## 2016-11-22 NOTE — Progress Notes (Signed)
Reviewed with patient in office. See office note

## 2016-11-22 NOTE — Telephone Encounter (Signed)
Left vm for pt to call back, need to know how much BP pill she has left?

## 2016-11-22 NOTE — Patient Instructions (Signed)
URI Instructions: Flonase and Afrin use: apply 1spray of afrin in each nare, wait 42mins, then apply 2sprays of flonase in each nare. Use both nasal spray consecutively x 3days, then flonase only for at least 14days.  Encourage adequate oral hydration.  Use over-the-counter  "cold" medicines  such as "Tylenol cold" , "Advil cold",  "Mucinex" or" Mucinex DM"  for cough and congestion.  Avoid decongestants if you have high blood pressure. Use" Delsym" or" Robitussin" cough syrup varietis for cough.  You can use plain "Tylenol" or "Advi"l for fever, chills and achyness.   "Common cold" symptoms are usually triggered by a virus.  The antibiotics are usually not necessary. On average, a" viral cold" illness would take 4-7 days to resolve. Please, make an appointment if you are not better or if you're worse.  Call office for oral abx prescription if no improvement by Friday.

## 2016-11-22 NOTE — Telephone Encounter (Signed)
Pt has an appt with [place] on 12/01/2016 for 3 month follow up. She was wondering if she need to keep her appt and can we refill her BP med sooner or she has to wait until she see you on 12/01/16. Please advise.

## 2016-11-22 NOTE — Progress Notes (Signed)
Subjective:  Patient ID: Cynthia Carpenter, female    DOB: 1986-06-04  Age: 30 y.o. MRN: [id number]  CC: Sore Throat (sore throat,headache,dizzy,cough thick green,chill for 4 days. took OTC)   Sore Throat   This is a new problem. The current episode started in the past 7 days. The problem has been gradually worsening. The maximum temperature recorded prior to her arrival was 100.4 - 100.9 F. Associated symptoms include congestion, coughing, headaches, a hoarse voice, a plugged ear sensation and swollen glands. Pertinent negatives include no abdominal pain, diarrhea, drooling, ear discharge, ear pain, neck pain, shortness of breath, stridor, trouble swallowing or vomiting. She has had no exposure to strep or mono. She has tried acetaminophen and NSAIDs for the symptoms.    Outpatient Medications Prior to Visit  Medication Sig Dispense Refill  . amLODipine (NORVASC) 10 MG tablet Take 1 tablet (10 mg total) by mouth daily. 30 tablet 2  . hydrochlorothiazide (HYDRODIURIL) 12.5 MG tablet Take 1 tablet (12.5 mg total) by mouth daily. 30 tablet 2  . OVER THE COUNTER MEDICATION hctz     No facility-administered medications prior to visit.     ROS See HPI  Objective:  BP (!) 136/98   Pulse 97   Temp 99.3 F (37.4 C)   Ht 5\' 3"  (1.6 m)   Wt 196 lb (88.9 kg)   SpO2 97%   BMI 34.72 kg/m   BP Readings from Last 3 Encounters:  11/22/16 (!) 136/98  09/02/16 (!) 188/106  08/15/14 136/86    Wt Readings from Last 3 Encounters:  11/22/16 196 lb (88.9 kg)  09/02/16 205 lb (93 kg)  01/08/13 194 lb (88 kg)    Physical Exam  Constitutional: She is oriented to person, place, and time.  HENT:  Right Ear: Tympanic membrane, external ear and ear canal normal.  Left Ear: Tympanic membrane, external ear and ear canal normal.  Nose: Mucosal edema and rhinorrhea present. Right sinus exhibits maxillary sinus tenderness and frontal sinus tenderness. Left sinus exhibits maxillary sinus tenderness  and frontal sinus tenderness.  Mouth/Throat: Uvula is midline. No trismus in the jaw. Posterior oropharyngeal erythema present. No oropharyngeal exudate.  Eyes: No scleral icterus.  Neck: Normal range of motion. Neck supple.  Cardiovascular: Normal rate and normal heart sounds.   Pulmonary/Chest: Effort normal and breath sounds normal.  Musculoskeletal: She exhibits no edema.  Lymphadenopathy:    She has cervical adenopathy.  Neurological: She is alert and oriented to person, place, and time.  Vitals reviewed.   Lab Results  Component Value Date   WBC 11.1 (H) 09/02/2016   HGB 11.8 (L) 09/02/2016   HCT 35.0 (L) 09/02/2016   PLT 399.0 09/02/2016   GLUCOSE 82 09/02/2016   CHOL 122 09/02/2016   TRIG 97.0 09/02/2016   HDL 41.00 09/02/2016   LDLDIRECT 81 12/27/2012   LDLCALC 62 09/02/2016   ALT 14 09/02/2016   AST 14 09/02/2016   NA 139 09/02/2016   K 3.4 (L) 09/02/2016   CL 104 09/02/2016   CREATININE 0.63 09/02/2016   BUN 8 09/02/2016   CO2 30 09/02/2016   TSH 1.57 09/02/2016   HGBA1C 5.4 12/27/2012    Ms Digital Screening Tomo Bilateral  Result Date: 11/02/2016 CLINICAL DATA:  Screening. Baseline screening mammogram. EXAM: 2D DIGITAL SCREENING BILATERAL MAMMOGRAM WITH CAD AND ADJUNCT TOMO COMPARISON:  None. ACR Breast Density Category b: There are scattered areas of fibroglandular density. FINDINGS: There are no findings suspicious for malignancy. Images were  processed with CAD. IMPRESSION: No mammographic evidence of malignancy. A result letter of this screening mammogram will be mailed directly to the patient. RECOMMENDATION: Screening mammogram at age 72. (Code:[id number]) BI-RADS CATEGORY  1: Negative. Electronically Signed   By: Franki Cabot M.D.   On: 11/02/2016 14:01    Assessment & Plan:   Cynthia Carpenter was seen today for sore throat.  Diagnoses and all orders for this visit:  Acute URI -     POCT Influenza A/B -     methylPREDNISolone acetate (DEPO-MEDROL)  injection 40 mg; Inject 1 mL (40 mg total) into the muscle once. -     fluticasone (FLONASE) 50 MCG/ACT nasal spray; Place 2 sprays into both nostrils daily. -     oxymetazoline (AFRIN NASAL SPRAY) 0.05 % nasal spray; Place 1 spray into both nostrils 2 (two) times daily. Use only for 3days, then stop -     dextromethorphan-guaiFENesin (MUCINEX DM) 30-600 MG 12hr tablet; Take 1 tablet by mouth 2 (two) times daily as needed for cough. -     benzonatate (TESSALON) 100 MG capsule; Take 1 capsule (100 mg total) by mouth 3 (three) times daily as needed for cough. -     sodium chloride (OCEAN) 0.65 % SOLN nasal spray; Place 1 spray into both nostrils as needed for congestion.   I am having Cynthia Carpenter start on fluticasone, oxymetazoline, dextromethorphan-guaiFENesin, benzonatate, and sodium chloride. I am also having her maintain her OVER THE COUNTER MEDICATION, amLODipine, hydrochlorothiazide, losartan, amLODipine, and hydrochlorothiazide. We will continue to administer methylPREDNISolone acetate.  Meds ordered this encounter  Medications  . losartan (COZAAR) 50 MG tablet    Sig: Take 50 mg by mouth daily.  Marland Kitchen amLODipine (NORVASC) 10 MG tablet    Sig: Take 10 mg by mouth daily.  . hydrochlorothiazide (HYDRODIURIL) 12.5 MG tablet    Sig: Take 12.5 mg by mouth daily.  . methylPREDNISolone acetate (DEPO-MEDROL) injection 40 mg  . fluticasone (FLONASE) 50 MCG/ACT nasal spray    Sig: Place 2 sprays into both nostrils daily.    Dispense:  16 g    Refill:  0    Order Specific Question:   Supervising Provider    Answer:   Cassandria Anger [[id number]]  . oxymetazoline (AFRIN NASAL SPRAY) 0.05 % nasal spray    Sig: Place 1 spray into both nostrils 2 (two) times daily. Use only for 3days, then stop    Dispense:  30 mL    Refill:  0    Order Specific Question:   Supervising Provider    Answer:   Cassandria Anger [[id number]]  . dextromethorphan-guaiFENesin (MUCINEX DM) 30-600 MG 12hr tablet    Sig: Take 1  tablet by mouth 2 (two) times daily as needed for cough.    Dispense:  14 tablet    Refill:  0    Order Specific Question:   Supervising Provider    Answer:   Cassandria Anger [[id number]]  . benzonatate (TESSALON) 100 MG capsule    Sig: Take 1 capsule (100 mg total) by mouth 3 (three) times daily as needed for cough.    Dispense:  20 capsule    Refill:  0    Order Specific Question:   Supervising Provider    Answer:   Cassandria Anger [[id number]]  . sodium chloride (OCEAN) 0.65 % SOLN nasal spray    Sig: Place 1 spray into both nostrils as needed for congestion.    Dispense:  15 mL  Refill:  0    Order Specific Question:   Supervising Provider    Answer:   Cassandria Anger [[id number]]    Follow-up: Return if symptoms worsen or fail to improve.  Wilfred Lacy, NP

## 2016-11-22 NOTE — Telephone Encounter (Signed)
She needs to keep upcoming appt.

## 2016-11-23 NOTE — Telephone Encounter (Signed)
Pt called in said that she has enough to make it to her appt

## 2016-11-23 NOTE — Telephone Encounter (Signed)
Left another vm for pt to call back.

## 2016-12-01 ENCOUNTER — Ambulatory Visit: Payer: Self-pay | Admitting: Nurse Practitioner

## 2016-12-02 ENCOUNTER — Ambulatory Visit: Payer: Self-pay | Admitting: Nurse Practitioner

## 2017-08-01 ENCOUNTER — Encounter (HOSPITAL_COMMUNITY): Payer: Self-pay | Admitting: Emergency Medicine

## 2017-08-01 ENCOUNTER — Emergency Department (HOSPITAL_COMMUNITY): Payer: Self-pay

## 2017-08-01 ENCOUNTER — Emergency Department (HOSPITAL_COMMUNITY)
Admission: EM | Admit: 2017-08-01 | Discharge: 2017-08-01 | Disposition: A | Discharge status: Home / Self Care | Payer: Self-pay | Attending: Emergency Medicine | Admitting: Emergency Medicine

## 2017-08-01 DIAGNOSIS — E876 Hypokalemia: Secondary | ICD-10-CM

## 2017-08-01 DIAGNOSIS — Z79899 Other long term (current) drug therapy: Secondary | ICD-10-CM | POA: Insufficient documentation

## 2017-08-01 DIAGNOSIS — J45909 Unspecified asthma, uncomplicated: Secondary | ICD-10-CM | POA: Insufficient documentation

## 2017-08-01 DIAGNOSIS — R0789 Other chest pain: Secondary | ICD-10-CM

## 2017-08-01 DIAGNOSIS — I10 Essential (primary) hypertension: Secondary | ICD-10-CM

## 2017-08-01 LAB — BASIC METABOLIC PANEL
Anion gap: 9 (ref 5–15)
BUN: 7 mg/dL (ref 6–20)
CALCIUM: 9.5 mg/dL (ref 8.9–10.3)
CO2: 28 mmol/L (ref 22–32)
CREATININE: 0.62 mg/dL (ref 0.44–1.00)
Chloride: 100 mmol/L — ABNORMAL LOW (ref 101–111)
GFR calc non Af Amer: >60 mL/min (ref >60)
Glucose, Bld: 94 mg/dL (ref 65–99)
Potassium: 2.9 mmol/L — ABNORMAL LOW (ref 3.5–5.1)
SODIUM: 137 mmol/L (ref 135–145)

## 2017-08-01 LAB — I-STAT TROPONIN, ED: TROPONIN I, POC: 0 ng/mL (ref 0.00–0.08)

## 2017-08-01 LAB — CBC
HCT: 37.2 % (ref 36.0–46.0)
Hemoglobin: 12.3 g/dL (ref 12.0–15.0)
MCH: 25.9 pg — AB (ref 26.0–34.0)
MCHC: 33.1 g/dL (ref 30.0–36.0)
MCV: 78.3 fL (ref 78.0–100.0)
PLATELETS: 400 10*3/uL (ref 150–400)
RBC: 4.75 MIL/uL (ref 3.87–5.11)
RDW: 13.6 % (ref 11.5–15.5)
WBC: 13.4 10*3/uL — AB (ref 4.0–10.5)

## 2017-08-01 LAB — I-STAT BETA HCG BLOOD, ED (MC, WL, AP ONLY): I-stat hCG, quantitative: <5 m[IU]/mL (ref <5)

## 2017-08-01 MED ORDER — POTASSIUM CHLORIDE CRYS ER 20 MEQ PO TBCR
40.0000 meq | EXTENDED_RELEASE_TABLET | Freq: Once | ORAL | Status: AC
  Administered 2017-08-01: 40 meq via ORAL
  Filled 2017-08-01: qty 2

## 2017-08-01 MED ORDER — IBUPROFEN 800 MG PO TABS
800.0000 mg | ORAL_TABLET | Freq: Once | ORAL | Status: AC
  Administered 2017-08-01: 800 mg via ORAL
  Filled 2017-08-01: qty 1

## 2017-08-01 MED ORDER — POTASSIUM CHLORIDE ER 20 MEQ PO TBCR: 20.0000 meq | EXTENDED_RELEASE_TABLET | Freq: Every day | ORAL | 0 refills | Status: DC

## 2017-08-01 MED ORDER — IBUPROFEN 400 MG PO TABS
400.0000 mg | ORAL_TABLET | Freq: Once | ORAL | Status: AC | PRN
  Administered 2017-08-01: 400 mg via ORAL

## 2017-08-01 MED ORDER — AMLODIPINE BESYLATE 10 MG PO TABS: 10.0000 mg | ORAL_TABLET | Freq: Every day | ORAL | 1 refills | Status: DC

## 2017-08-01 MED ORDER — LOSARTAN POTASSIUM 50 MG PO TABS: 50.0000 mg | ORAL_TABLET | Freq: Every day | ORAL | 1 refills | Status: DC

## 2017-08-01 MED ORDER — IBUPROFEN 400 MG PO TABS
ORAL_TABLET | ORAL | Status: AC
  Filled 2017-08-01: qty 1

## 2017-08-01 MED ORDER — HYDROCHLOROTHIAZIDE 12.5 MG PO TABS: 12.5000 mg | ORAL_TABLET | Freq: Every day | ORAL | 1 refills | Status: DC

## 2017-08-01 NOTE — ED Provider Notes (Signed)
TIME SEEN: 6:07 AM  CHIEF COMPLAINT: Chest pain  HPI: Patient is a 31 year old female with history of asthma, hypertension who presents the emergency department with complaints of chest pain. Describes it as a central pain that started last night that is a throbbing pain and is worse with sitting straight up, lifting her arms above her head, bending over. Pain also worse with palpation. States she felt short of breath because she could not get a deep breath without having pain. Pain is described as constant but has improved and shortness of breath is now gone. No fevers, cough.  No history of PE, DVT, exogenous estrogen use, recent fractures, surgery, trauma, hospitalization or prolonged travel. No lower extremity swelling or pain. No calf tenderness.  States she is supposed to be losartan, HCTZ and Norvasc for hypertension. She states she has been without these for several months when she started having chest pain tonight she took a dose of her mother-in-law's medications which are the same as when she normally takes. She states she has a PCP but has not followed up for refills because she recently lost her insurance.  ROS: See HPI Constitutional: no fever  Eyes: no drainage  ENT: no runny nose   Cardiovascular:   chest pain  Resp:  SOB  GI: no vomiting GU: no dysuria Integumentary: no rash  Allergy: no hives  Musculoskeletal: no leg swelling  Neurological: no slurred speech ROS otherwise negative  PAST MEDICAL HISTORY/PAST SURGICAL HISTORY:  Past Medical History:  Diagnosis Date  . Asthma   . Hypertension   . Obesity     MEDICATIONS:  Prior to Admission medications   Medication Sig Start Date End Date Taking? Authorizing Provider  amLODipine (NORVASC) 10 MG tablet Take 1 tablet (10 mg total) by mouth daily. 09/02/16 10/02/16  Nche, Charlene Brooke, NP  amLODipine (NORVASC) 10 MG tablet Take 10 mg by mouth daily.    [provider]  benzonatate (TESSALON) 100 MG capsule Take 1  capsule (100 mg total) by mouth 3 (three) times daily as needed for cough. 11/22/16   Nche, Charlene Brooke, NP  dextromethorphan-guaiFENesin (MUCINEX DM) 30-600 MG 12hr tablet Take 1 tablet by mouth 2 (two) times daily as needed for cough. 11/22/16   Nche, Charlene Brooke, NP  fluticasone (FLONASE) 50 MCG/ACT nasal spray Place 2 sprays into both nostrils daily. 11/22/16   Nche, Charlene Brooke, NP  hydrochlorothiazide (HYDRODIURIL) 12.5 MG tablet Take 1 tablet (12.5 mg total) by mouth daily. 09/02/16 10/02/16  Nche, Charlene Brooke, NP  hydrochlorothiazide (HYDRODIURIL) 12.5 MG tablet Take 12.5 mg by mouth daily.    [provider]  losartan (COZAAR) 50 MG tablet Take 50 mg by mouth daily.    [provider]  OVER THE COUNTER MEDICATION hctz    [provider]  oxymetazoline (AFRIN NASAL SPRAY) 0.05 % nasal spray Place 1 spray into both nostrils 2 (two) times daily. Use only for 3days, then stop 11/22/16   Nche, Charlene Brooke, NP  sodium chloride (OCEAN) 0.65 % SOLN nasal spray Place 1 spray into both nostrils as needed for congestion. 11/22/16   Nche, Charlene Brooke, NP    ALLERGIES:  No Known Allergies  SOCIAL HISTORY:  Social History  Substance Use Topics  . Smoking status: Never Smoker  . Smokeless tobacco: Never Used  . Alcohol use Yes     Comment: socially    FAMILY HISTORY: Family History  Problem Relation Age of Onset  . Arthritis Mother   .  Cancer Mother   . Hyperlipidemia Mother   . Hyperthyroidism Mother   . Alcohol abuse Father   . Hypertension Father   . Diabetes Brother   . Cancer Maternal Grandmother   . Hyperlipidemia Maternal Grandmother   . Hypertension Maternal Grandmother   . Depression Paternal Grandmother     EXAM: BP (!) 183/119 (BP Location: Right Arm)   Pulse 93   Temp 99.1 F (37.3 C) (Oral)   Resp 16   Ht 5\' 3"  (1.6 m)   Wt 90.7 kg (200 lb)   SpO2 100%   BMI 35.43 kg/m  CONSTITUTIONAL: Alert and oriented and responds  appropriately to questions. Well-appearing; well-nourished, Obese HEAD: Normocephalic EYES: Conjunctivae clear, pupils appear equal, EOMI ENT: normal nose; moist mucous membranes NECK: Supple, no meningismus, no nuchal rigidity, no LAD  CHEST:  Chest wall is tender to palpation over the central anterior chest wall which reproduces her pain.  No crepitus, ecchymosis, erythema, warmth, rash or other lesions present.   CARD: RRR; S1 and S2 appreciated; no murmurs, no clicks, no rubs, no gallops RESP: Normal chest excursion without splinting or tachypnea; breath sounds clear and equal bilaterally; no wheezes, no rhonchi, no rales, no hypoxia or respiratory distress, speaking full sentences ABD/GI: Normal bowel sounds; non-distended; soft, non-tender, no rebound, no guarding, no peritoneal signs, no hepatosplenomegaly BACK:  The back appears normal and is non-tender to palpation, there is no CVA tenderness EXT: Normal ROM in all joints; non-tender to palpation; no edema; normal capillary refill; no cyanosis, no calf tenderness or swelling    SKIN: Normal color for age and race; warm; no rash NEURO: Moves all extremities equally PSYCH: The patient's mood and manner are appropriate. Grooming and personal hygiene are appropriate.  MEDICAL DECISION MAKING: Patient here with very atypical chest pain. She does have mild leukocytosis but denies any recent fevers. Doubt pericarditis. Doubt myocarditis. Troponin is negative. EKG shows no ischemic abnormality, arrhythmia, interval changes. Doubt ACS. She is PERC negative. Her chest x-ray is clear. No infiltrate. I suspect this is musculoskeletal in nature. Recommend alternating Tylenol and Motrin for pain. Doubt dissection. She does have history of hypertension and is requesting refills of her home medications. I will give her refills of these medications as well as PCP follow-up. Discussed return precautions. Patient agrees with this plan.  At this time, I do not  feel there is any life-threatening condition present. I have reviewed and discussed all results (EKG, imaging, lab, urine as appropriate) and exam findings with patient/family. I have reviewed nursing notes and appropriate previous records.  I feel the patient is safe to be discharged home without further emergent workup and can continue workup as an outpatient as needed. Discussed usual and customary return precautions. Patient/family verbalize understanding and are comfortable with this plan.  Outpatient follow-up has been provided if needed. All questions have been answered.      EKG Interpretation  Date/Time:  Tuesday August 01 2017 02:23:01 EDT Ventricular Rate:  88 PR Interval:  178 QRS Duration: 82 QT Interval:  386 QTC Calculation: 467 R Axis:   19 Text Interpretation:  Normal sinus rhythm Normal ECG Confirmed by Frederica Chrestman, Cyril Mourning [phone]) on 08/01/2017 6:07:24 AM         Kingsley Herandez, Delice Bison, DO 08/01/17 [id number]

## 2017-08-01 NOTE — ED Notes (Signed)
Pt stated that her headache has come back and is asking for more medicine

## 2017-08-01 NOTE — Discharge Instructions (Signed)
You may alternate Tylenol 1000 mg every 6 hours as needed for pain and Ibuprofen 800 mg every 8 hours as needed for pain.  Please take Ibuprofen with food.   Your potassium level was slightly low today at 2.9. You should have this rechecked by your primary care physician in one week.  Your hydrochlorothiazide may make your potassium drop so it is very important that you have this rechecked. Your primary care physician may decide to take you off of hydrochlorothiazide.    To find a primary care or specialty doctor please call [phone] or [phone] to access "[facility] Doctor Service."  You may also go on the [facility] website at [email or web address]  There are also multiple [facility], [facility], [facility] and [facility] practices throughout the [place] that are frequently accepting new patients. You may find a clinic that is close to your home and contact them.  [place] [id number] [address]  [place] [address] [address] [place] [phone]

## 2017-08-01 NOTE — ED Triage Notes (Signed)
Pt reports central CP onset 1800. Also reports dizziness, nausea, HA. Pt hypertensive in triage, pt states she has been out of meds for several months.

## 2018-05-16 ENCOUNTER — Encounter: Payer: Self-pay | Admitting: Family

## 2018-05-16 ENCOUNTER — Ambulatory Visit: Payer: PRIVATE HEALTH INSURANCE | Admitting: Family

## 2018-05-16 VITALS — BP 138/90 | HR 71 | Temp 99.0°F | Ht 63.0 in | Wt 209.1 lb

## 2018-05-16 DIAGNOSIS — R5383 Other fatigue: Secondary | ICD-10-CM

## 2018-05-16 DIAGNOSIS — R102 Pelvic and perineal pain: Secondary | ICD-10-CM | POA: Diagnosis not present

## 2018-05-16 DIAGNOSIS — I10 Essential (primary) hypertension: Secondary | ICD-10-CM | POA: Diagnosis not present

## 2018-05-16 DIAGNOSIS — Z1231 Encounter for screening mammogram for malignant neoplasm of breast: Secondary | ICD-10-CM | POA: Diagnosis not present

## 2018-05-16 DIAGNOSIS — Z13220 Encounter for screening for lipoid disorders: Secondary | ICD-10-CM | POA: Diagnosis not present

## 2018-05-16 DIAGNOSIS — J069 Acute upper respiratory infection, unspecified: Secondary | ICD-10-CM | POA: Diagnosis not present

## 2018-05-16 DIAGNOSIS — Z1239 Encounter for other screening for malignant neoplasm of breast: Secondary | ICD-10-CM

## 2018-05-16 MED ORDER — AMLODIPINE BESYLATE 10 MG PO TABS: 10.0000 mg | ORAL_TABLET | Freq: Every day | ORAL | 1 refills | Status: DC

## 2018-05-16 MED ORDER — FLUTICASONE PROPIONATE 50 MCG/ACT NA SUSP: 2.0000 | Freq: Every day | NASAL | 6 refills | Status: DC

## 2018-05-16 MED ORDER — LOSARTAN POTASSIUM-HCTZ 100-25 MG PO TABS: 1.0000 | ORAL_TABLET | Freq: Every day | ORAL | 1 refills | Status: DC

## 2018-05-16 NOTE — Progress Notes (Signed)
Cynthia Carpenter is a 32 y.o. female with the following history as recorded in EpicCare:  Patient Active Problem List   Diagnosis Date Noted  . History of unprotected sex 01/08/2013  . FH: breast cancer in first degree relative 01/08/2013  . Obesity 12/27/2012  . HIDRADENITIS SUPPURATIVA 03/30/2010  . Hypertension 03/30/2010    Current Outpatient Medications  Medication Sig Dispense Refill  . albuterol (PROVENTIL HFA;VENTOLIN HFA) 108 (90 Base) MCG/ACT inhaler Inhale into the lungs every 6 (six) hours as needed for wheezing or shortness of breath.    Marland Kitchen amLODipine (NORVASC) 10 MG tablet Take 1 tablet (10 mg total) by mouth daily. 90 tablet 1  . fluticasone (FLONASE) 50 MCG/ACT nasal spray Place 2 sprays into both nostrils daily. 16 g 6  . sodium chloride (OCEAN) 0.65 % SOLN nasal spray Place 1 spray into both nostrils as needed for congestion. 15 mL 0  . losartan-hydrochlorothiazide (HYZAAR) 100-25 MG tablet Take 1 tablet by mouth daily. 90 tablet 1   No current facility-administered medications for this visit.     Allergies: Patient has no known allergies.  Past Medical History:  Diagnosis Date  . Asthma   . Hypertension   . Obesity     Past Surgical History:  Procedure Laterality Date  . TUBAL LIGATION      Family History  Problem Relation Age of Onset  . Arthritis Mother   . Cancer Mother   . Hyperlipidemia Mother   . Hyperthyroidism Mother   . Alcohol abuse Father   . Hypertension Father   . Diabetes Brother   . Cancer Maternal Grandmother   . Hyperlipidemia Maternal Grandmother   . Hypertension Maternal Grandmother   . Depression Paternal Grandmother     Social History   Tobacco Use  . Smoking status: Never Smoker  . Smokeless tobacco: Never Used  Substance Use Topics  . Alcohol use: Yes    Comment: socially    Subjective:  Patient presents for follow-up on hypertension; takes Losartan 50 mg and HCTZ 12.5 mg in the am and Amlodipine 10 mg at night;  Denies any chest pain, shortness of breath, blurred vision or headache. Admits she is taking her medication regularly but not checking her blood pressure regularly. Did check her pressure last week and was at 141/101;  Overdue for pap smear ( FH cervical cancer)/ mammogram ( mother diagnosed with breast cancer at age 32); Wants to come back for fasting labs;   Objective:  Vitals:   05/16/18 1627  BP: 138/90  Pulse: 71  Temp: 99 F (37.2 C)  TempSrc: Oral  SpO2: 99%  Weight: 209 lb 1.3 oz (94.8 kg)  Height: '5\' 3"'  (1.6 m)    General: Well developed, well nourished, in no acute distress  Skin : Warm and dry.  Head: Normocephalic and atraumatic  Eyes: Sclera and conjunctiva clear; pupils round and reactive to light; extraocular movements intact  Ears: External normal; canals clear; tympanic membranes normal  Oropharynx: Pink, supple. No suspicious lesions  Neck: Supple without thyromegaly, adenopathy  Lungs: Respirations unlabored; clear to auscultation bilaterally without wheeze, rales, rhonchi  CVS exam: normal rate and regular rhythm.  Neurologic: Alert and oriented; speech intact; face symmetrical; moves all extremities well; CNII-XII intact without focal deficit  Assessment:  1. Essential hypertension   2. Acute URI   3. Lipid screening   4. Other fatigue   5. Pelvic pain   6. Breast cancer screening     Plan:  1.  Uncontrolled; increase to Losartan HCT 100/25 and continue Amlodipine 10 mg; follow-up in 2 weeks; Return for fasting labs before next appt; update mammogram, pelvic ultrasound; will plan for pap smear at next OV;   Return in about 2 weeks (around 05/30/2018) for blood pressure/ pap smear.  Orders Placed This Encounter  Procedures  . US Pelvis Complete    Standing Status:   Future    Standing Expiration Date:   07/17/2019    Order Specific Question:   Reason for Exam (SYMPTOM  OR DIAGNOSIS REQUIRED)    Answer:   pelvic pain    Order Specific Question:    Preferred imaging location?    Answer:   [facility]  . MM Digital Screening    Standing Status:   Future    Standing Expiration Date:   07/17/2019    Order Specific Question:   Reason for Exam (SYMPTOM  OR DIAGNOSIS REQUIRED)    Answer:   FH breast cancer    Order Specific Question:   Is the patient pregnant?    Answer:   No    Order Specific Question:   Preferred imaging location?    Answer:   [facility]  . CBC w/Diff    Standing Status:   Future    Standing Expiration Date:   05/16/2019  . Comp Met (CMET)    Standing Status:   Future    Standing Expiration Date:   05/16/2019  . Lipid panel    Standing Status:   Future    Standing Expiration Date:   05/17/2019  . Ferritin    Standing Status:   Future    Standing Expiration Date:   05/16/2019  . TSH    Standing Status:   Future    Standing Expiration Date:   05/16/2019    Requested Prescriptions   Signed Prescriptions Disp Refills  . losartan-hydrochlorothiazide (HYZAAR) 100-25 MG tablet 90 tablet 1    Sig: Take 1 tablet by mouth daily.  Marland Kitchen amLODipine (NORVASC) 10 MG tablet 90 tablet 1    Sig: Take 1 tablet (10 mg total) by mouth daily.  . fluticasone (FLONASE) 50 MCG/ACT nasal spray 16 g 6    Sig: Place 2 sprays into both nostrils daily.

## 2018-05-17 ENCOUNTER — Other Ambulatory Visit: Payer: Self-pay | Admitting: Family

## 2018-05-17 ENCOUNTER — Other Ambulatory Visit (INDEPENDENT_AMBULATORY_CARE_PROVIDER_SITE_OTHER): Payer: PRIVATE HEALTH INSURANCE

## 2018-05-17 DIAGNOSIS — I10 Essential (primary) hypertension: Secondary | ICD-10-CM

## 2018-05-17 DIAGNOSIS — Z13220 Encounter for screening for lipoid disorders: Secondary | ICD-10-CM

## 2018-05-17 DIAGNOSIS — R5383 Other fatigue: Secondary | ICD-10-CM | POA: Diagnosis not present

## 2018-05-17 LAB — COMPREHENSIVE METABOLIC PANEL
ALBUMIN: 4.2 g/dL (ref 3.5–5.2)
ALK PHOS: 71 U/L (ref 39–117)
ALT: 9 U/L (ref 0–35)
AST: 13 U/L (ref 0–37)
BILIRUBIN TOTAL: 0.3 mg/dL (ref 0.2–1.2)
BUN: 9 mg/dL (ref 6–23)
CALCIUM: 9.5 mg/dL (ref 8.4–10.5)
CO2: 29 meq/L (ref 19–32)
Chloride: 101 mEq/L (ref 96–112)
Creatinine, Ser: 0.66 mg/dL (ref 0.40–1.20)
GFR: 133.83 mL/min (ref >60.00)
Glucose, Bld: 101 mg/dL — ABNORMAL HIGH (ref 70–99)
Potassium: 3.4 mEq/L — ABNORMAL LOW (ref 3.5–5.1)
Sodium: 138 mEq/L (ref 135–145)
TOTAL PROTEIN: 8 g/dL (ref 6.0–8.3)

## 2018-05-17 LAB — CBC WITH DIFFERENTIAL/PLATELET
Basophils Absolute: 0 10*3/uL (ref 0.0–0.1)
Basophils Relative: 0.4 % (ref 0.0–3.0)
EOS ABS: 0.3 10*3/uL (ref 0.0–0.7)
Eosinophils Relative: 3.1 % (ref 0.0–5.0)
HEMATOCRIT: 36.2 % (ref 36.0–46.0)
HEMOGLOBIN: 12.1 g/dL (ref 12.0–15.0)
LYMPHS PCT: 24.8 % (ref 12.0–46.0)
Lymphs Abs: 2.2 10*3/uL (ref 0.7–4.0)
MCHC: 33.5 g/dL (ref 30.0–36.0)
MCV: 79.2 fl (ref 78.0–100.0)
MONOS PCT: 5.9 % (ref 3.0–12.0)
Monocytes Absolute: 0.5 10*3/uL (ref 0.1–1.0)
Neutro Abs: 5.9 10*3/uL (ref 1.4–7.7)
Neutrophils Relative %: 65.8 % (ref 43.0–77.0)
Platelets: 415 10*3/uL — ABNORMAL HIGH (ref 150.0–400.0)
RBC: 4.57 Mil/uL (ref 3.87–5.11)
RDW: 15.3 % (ref 11.5–15.5)
WBC: 9 10*3/uL (ref 4.0–10.5)

## 2018-05-17 LAB — LIPID PANEL
CHOLESTEROL: 134 mg/dL (ref 0–200)
HDL: 40.9 mg/dL (ref >39.00)
LDL Cholesterol: 69 mg/dL (ref 0–99)
NonHDL: 93.49
TRIGLYCERIDES: 123 mg/dL (ref 0.0–149.0)
Total CHOL/HDL Ratio: 3
VLDL: 24.6 mg/dL (ref 0.0–40.0)

## 2018-05-17 LAB — TSH: TSH: 1.74 u[IU]/mL (ref 0.35–4.50)

## 2018-05-17 LAB — FERRITIN: Ferritin: 18.1 ng/mL (ref 10.0–291.0)

## 2018-05-17 MED ORDER — POTASSIUM CHLORIDE ER 10 MEQ PO TBCR: 10.0000 meq | EXTENDED_RELEASE_TABLET | Freq: Every day | ORAL | 0 refills | Status: DC

## 2018-05-18 ENCOUNTER — Other Ambulatory Visit: Payer: Self-pay | Admitting: Family

## 2018-05-18 ENCOUNTER — Telehealth: Payer: Self-pay

## 2018-05-18 MED ORDER — HYDROCHLOROTHIAZIDE 25 MG PO TABS: 25.0000 mg | ORAL_TABLET | Freq: Every day | ORAL | 1 refills | Status: DC

## 2018-05-18 MED ORDER — LOSARTAN POTASSIUM 100 MG PO TABS: 100.0000 mg | ORAL_TABLET | Freq: Every day | ORAL | 1 refills | Status: DC

## 2018-05-18 NOTE — Telephone Encounter (Signed)
I called in Losartan and HCTZ separately. Is she unable to get this?

## 2018-05-18 NOTE — Telephone Encounter (Signed)
Spoke with patient earlier and his Losartan is on back order with [facility] on [address]. She was wondering if you were going to call in something different or if it could be called into a different pharmacy to see if they possibly had it?  2nd choice for pharmacy would be [facility] (already updated on her 87)

## 2018-05-21 NOTE — Telephone Encounter (Signed)
Called and left message for patient and CRM created incase she calls back.

## 2018-05-21 NOTE — Telephone Encounter (Signed)
Pt called back and given the message.

## 2018-05-25 NOTE — Addendum Note (Signed)
Addended by: Earnstine Regal on: 05/25/2018 11:21 AM   Modules accepted: Orders

## 2018-05-30 ENCOUNTER — Ambulatory Visit: Payer: PRIVATE HEALTH INSURANCE | Admitting: Family

## 2018-05-31 ENCOUNTER — Encounter: Payer: Self-pay | Admitting: Family

## 2018-05-31 ENCOUNTER — Other Ambulatory Visit (HOSPITAL_COMMUNITY)
Admission: RE | Admit: 2018-05-31 | Discharge: 2018-05-31 | Disposition: A | Discharge status: Home / Self Care | Payer: PRIVATE HEALTH INSURANCE | Source: Ambulatory Visit | Attending: Family | Admitting: Family

## 2018-05-31 ENCOUNTER — Ambulatory Visit: Payer: PRIVATE HEALTH INSURANCE | Admitting: Family

## 2018-05-31 ENCOUNTER — Other Ambulatory Visit: Payer: PRIVATE HEALTH INSURANCE

## 2018-05-31 VITALS — BP 134/82 | HR 66 | Temp 98.3°F | Ht 63.0 in | Wt 211.2 lb

## 2018-05-31 DIAGNOSIS — I10 Essential (primary) hypertension: Secondary | ICD-10-CM

## 2018-05-31 DIAGNOSIS — R062 Wheezing: Secondary | ICD-10-CM

## 2018-05-31 DIAGNOSIS — Z124 Encounter for screening for malignant neoplasm of cervix: Secondary | ICD-10-CM

## 2018-05-31 MED ORDER — ALBUTEROL SULFATE HFA 108 (90 BASE) MCG/ACT IN AERS: 1.0000 | INHALATION_SPRAY | Freq: Four times a day (QID) | RESPIRATORY_TRACT | 3 refills | Status: DC | PRN

## 2018-05-31 NOTE — Progress Notes (Signed)
Cynthia Carpenter is a 32 y.o. female with the following history as recorded in EpicCare:  Patient Active Problem List   Diagnosis Date Noted  . History of unprotected sex 01/08/2013  . FH: breast cancer in first degree relative 01/08/2013  . Obesity 12/27/2012  . HIDRADENITIS SUPPURATIVA 03/30/2010  . Hypertension 03/30/2010    Current Outpatient Medications  Medication Sig Dispense Refill  . albuterol (PROVENTIL HFA;VENTOLIN HFA) 108 (90 Base) MCG/ACT inhaler Inhale 1-2 puffs into the lungs every 6 (six) hours as needed for wheezing or shortness of breath. 1 Inhaler 3  . amLODipine (NORVASC) 10 MG tablet Take 1 tablet (10 mg total) by mouth daily. 90 tablet 1  . fluticasone (FLONASE) 50 MCG/ACT nasal spray Place 2 sprays into both nostrils daily. 16 g 6  . losartan-hydrochlorothiazide (HYZAAR) 100-25 MG tablet Take 1 tablet by mouth daily.  1  . potassium chloride (K-DUR) 10 MEQ tablet Take 1 tablet (10 mEq total) by mouth daily. 90 tablet 0  . sodium chloride (OCEAN) 0.65 % SOLN nasal spray Place 1 spray into both nostrils as needed for congestion. 15 mL 0   No current facility-administered medications for this visit.     Allergies: Patient has no known allergies.  Past Medical History:  Diagnosis Date  . Asthma   . Hypertension   . Obesity     Past Surgical History:  Procedure Laterality Date  . TUBAL LIGATION      Family History  Problem Relation Age of Onset  . Arthritis Mother   . Cancer Mother   . Hyperlipidemia Mother   . Hyperthyroidism Mother   . Alcohol abuse Father   . Hypertension Father   . Diabetes Brother   . Cancer Maternal Grandmother   . Hyperlipidemia Maternal Grandmother   . Hypertension Maternal Grandmother   . Depression Paternal Grandmother     Social History   Tobacco Use  . Smoking status: Never Smoker  . Smokeless tobacco: Never Used  Substance Use Topics  . Alcohol use: Yes    Comment: socially    Subjective:  2 week follow-up on  hypertension; doing well on combination of Losartan HCT 100/25 and Amlodipine 10 mg; feeling better; Denies any chest pain, shortness of breath, blurred vision or headache.  Also needs to get pap smear updated today; scheduled for mammogram and pelvic ultasound;     Objective:  Vitals:   05/31/18 1610  BP: 134/82  Pulse: 66  Temp: 98.3 F (36.8 C)  TempSrc: Oral  SpO2: 99%  Weight: 211 lb 3.2 oz (95.8 kg)  Height: 5\' 3"  (1.6 m)    General: Well developed, well nourished, in no acute distress  Skin : Warm and dry.  Head: Normocephalic and atraumatic  Lungs: Respirations unlabored; clear to auscultation bilaterally without wheeze, rales, rhonchi  CVS exam: normal rate and regular rhythm.  Vessels: Symmetric bilaterally  Neurologic: Alert and oriented; speech intact; face symmetrical; moves all extremities well; CNII-XII intact without focal deficit   Pelvic exam: normal external genitalia, vulva, vagina, cervix, uterus and adnexa.   Assessment:  1. Essential hypertension   2. Cervical cancer screening   3. Wheezing     Plan:  1. Stable; good improvement; continue same medications; follow-up in 6 months; reviewed recent labs; 2. Thin prep pap collected; 3. Stable; refill update on albuterol inhaler.    Return in about 6 months (around 11/30/2018).  No orders of the defined types were placed in this encounter.   Requested  Prescriptions   Signed Prescriptions Disp Refills  . albuterol (PROVENTIL HFA;VENTOLIN HFA) 108 (90 Base) MCG/ACT inhaler 1 Inhaler 3    Sig: Inhale 1-2 puffs into the lungs every 6 (six) hours as needed for wheezing or shortness of breath.

## 2018-06-04 ENCOUNTER — Other Ambulatory Visit: Payer: PRIVATE HEALTH INSURANCE

## 2018-06-05 ENCOUNTER — Other Ambulatory Visit: Payer: Self-pay | Admitting: Family

## 2018-06-05 LAB — CYTOLOGY - PAP
Bacterial vaginitis: POSITIVE — AB
Candida vaginitis: POSITIVE — AB
DIAGNOSIS: NEGATIVE
Trichomonas: NEGATIVE

## 2018-06-05 MED ORDER — FLUCONAZOLE 150 MG PO TABS: 150.0000 mg | ORAL_TABLET | ORAL | 0 refills | Status: DC

## 2018-06-05 MED ORDER — METRONIDAZOLE 500 MG PO TABS
500.0000 mg | ORAL_TABLET | Freq: Two times a day (BID) | ORAL | 0 refills | Status: DC
Start: 2018-06-05 — End: 2018-12-06

## 2018-06-11 ENCOUNTER — Ambulatory Visit
Admission: RE | Admit: 2018-06-11 | Discharge: 2018-06-11 | Disposition: A | Discharge status: Home / Self Care | Payer: PRIVATE HEALTH INSURANCE | Source: Ambulatory Visit | Attending: Family | Admitting: Family

## 2018-06-11 DIAGNOSIS — Z1239 Encounter for other screening for malignant neoplasm of breast: Secondary | ICD-10-CM

## 2018-06-12 ENCOUNTER — Ambulatory Visit
Admission: RE | Admit: 2018-06-12 | Discharge: 2018-06-12 | Disposition: A | Discharge status: Home / Self Care | Payer: PRIVATE HEALTH INSURANCE | Source: Ambulatory Visit | Attending: Family | Admitting: Family

## 2018-06-12 DIAGNOSIS — R102 Pelvic and perineal pain: Secondary | ICD-10-CM

## 2018-06-15 ENCOUNTER — Other Ambulatory Visit: Payer: Self-pay | Admitting: Family

## 2018-06-15 DIAGNOSIS — D259 Leiomyoma of uterus, unspecified: Secondary | ICD-10-CM

## 2018-07-02 NOTE — Addendum Note (Signed)
Addended by: Karren Cobble on: 07/02/2018 02:13 PM   Modules accepted: Orders

## 2018-08-11 IMAGING — DX DG CHEST 2V
2 series · 2 of 2 positions shown · non-contrast
Comparison: None.

CLINICAL DATA: Central chest pain.

EXAM:
CHEST  2 VIEW

[chest pa]
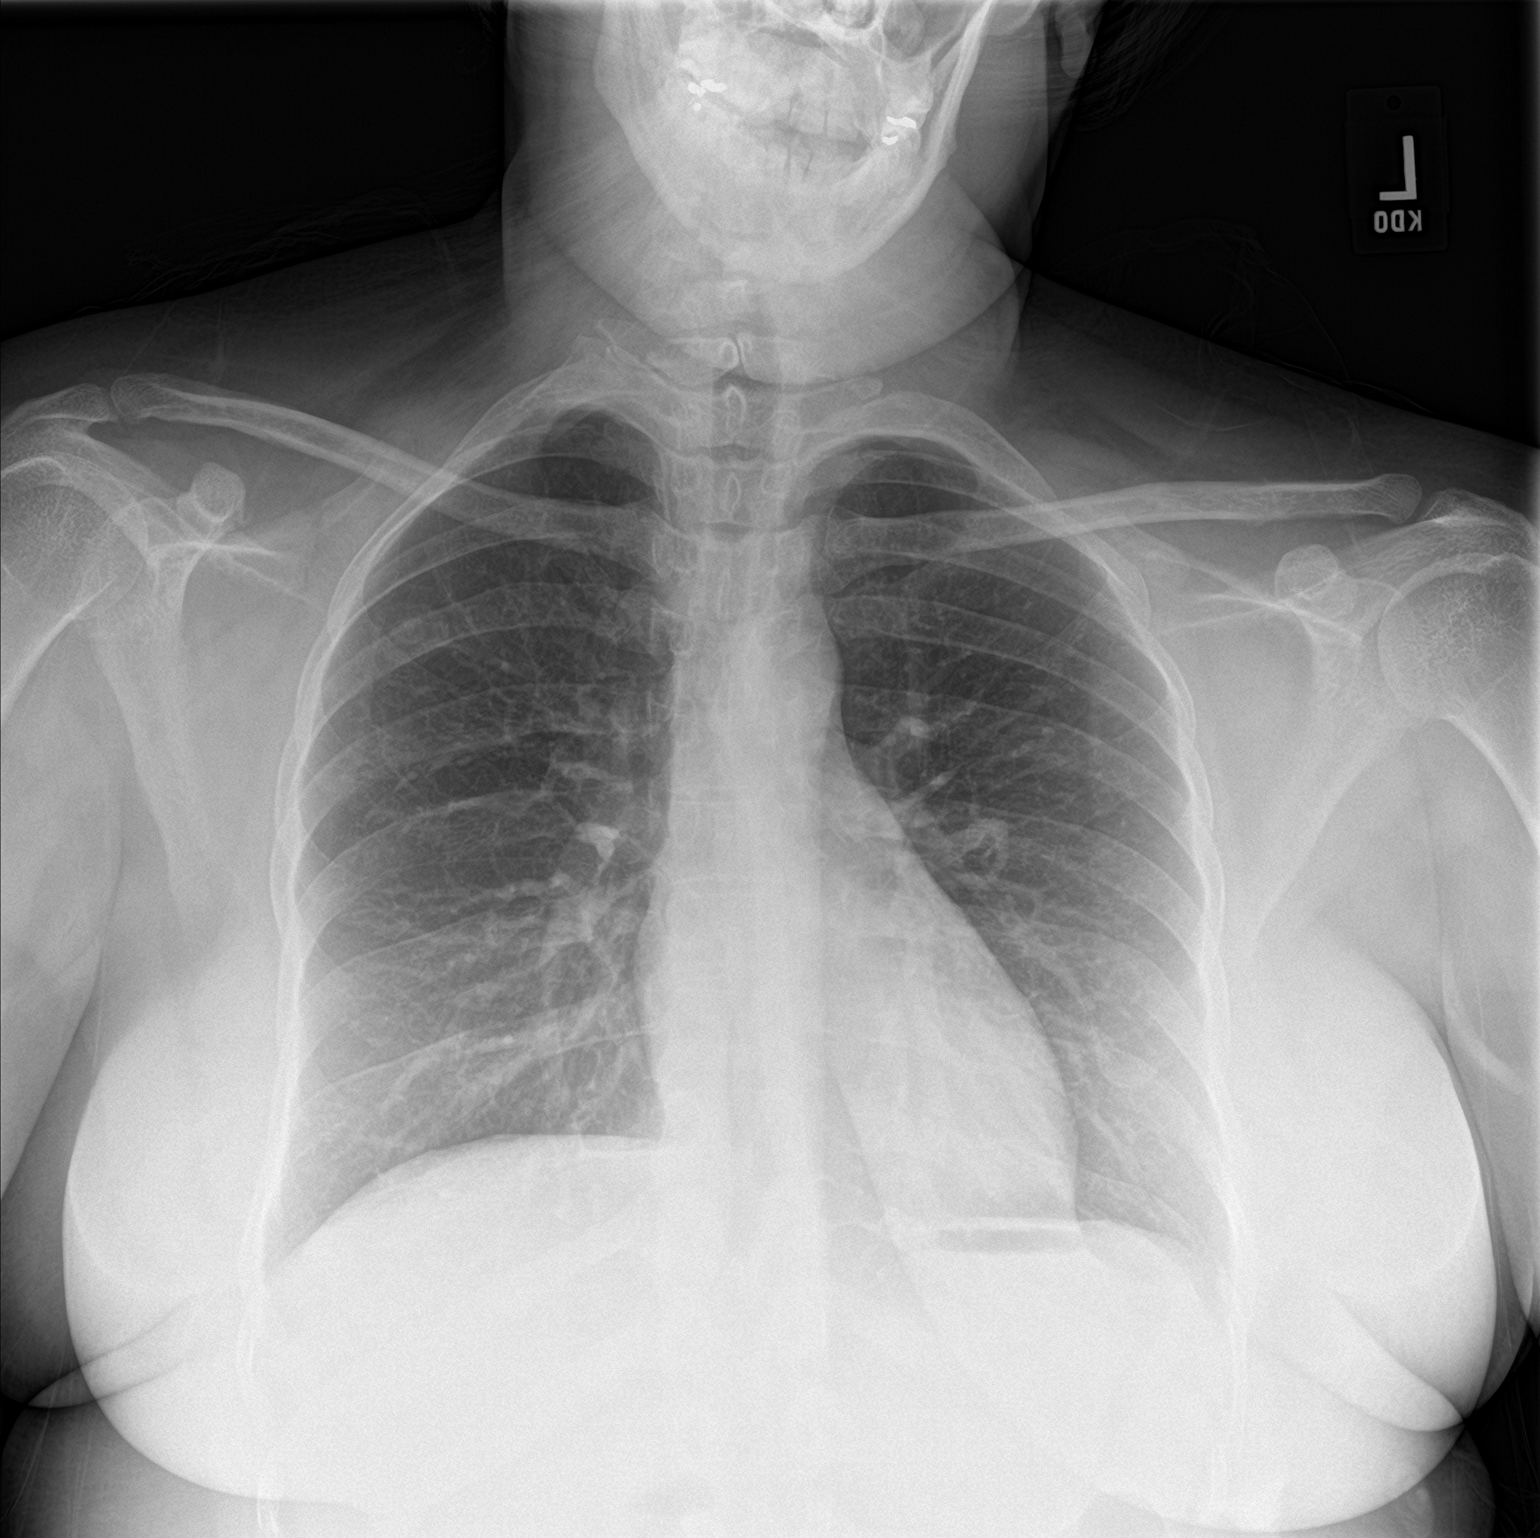

[chest lat]
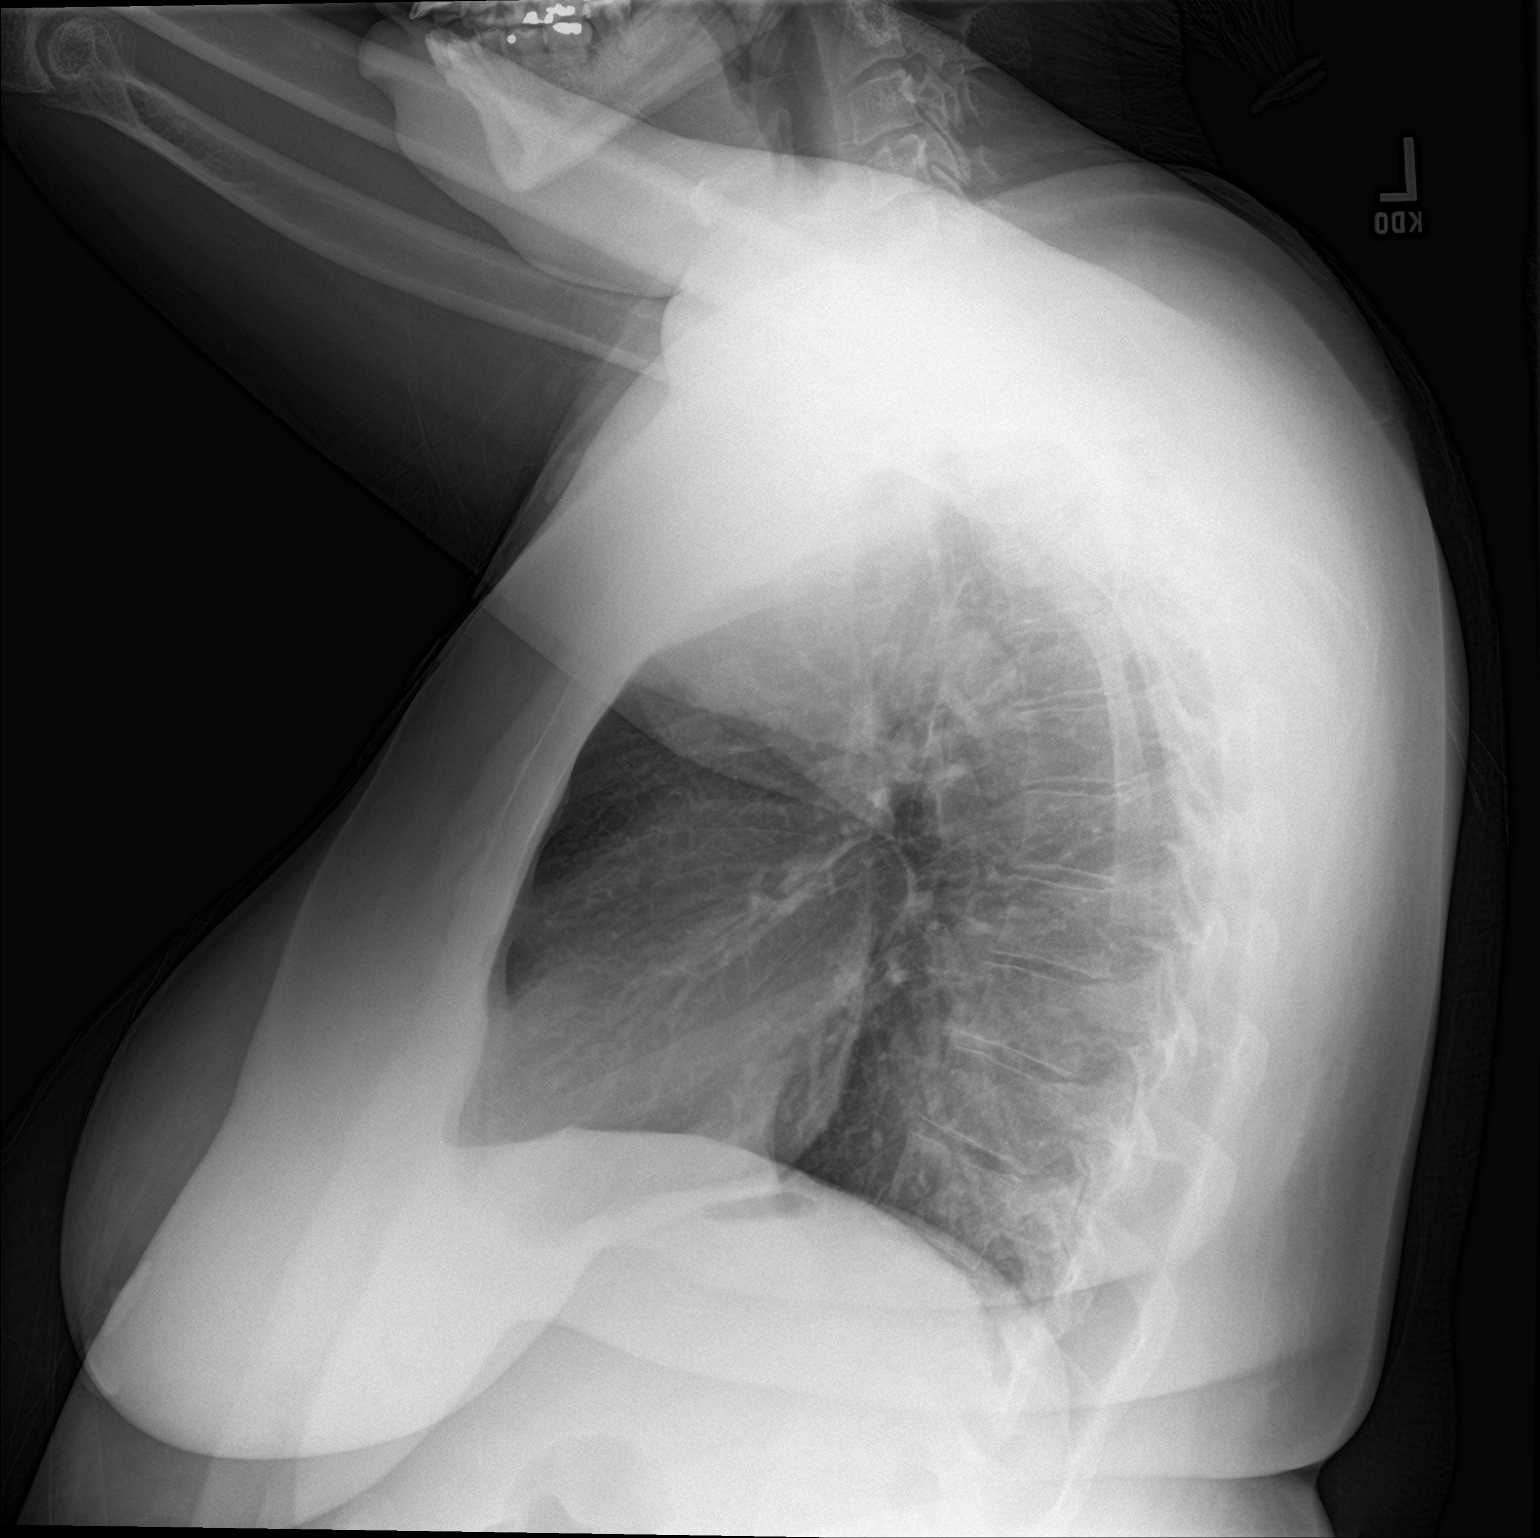

[2 of 2 positions shown; findings below may reference images not displayed]

FINDINGS: The cardiomediastinal contours are normal. The lungs are clear.
Pulmonary vasculature is normal. No consolidation, pleural effusion,
or pneumothorax. No acute osseous abnormalities are seen.
IMPRESSION: No acute pulmonary process.

## 2018-09-13 DIAGNOSIS — D259 Leiomyoma of uterus, unspecified: Secondary | ICD-10-CM | POA: Diagnosis not present

## 2018-09-13 DIAGNOSIS — N920 Excessive and frequent menstruation with regular cycle: Secondary | ICD-10-CM | POA: Diagnosis not present

## 2018-09-13 DIAGNOSIS — Z6831 Body mass index (BMI) 31.0-31.9, adult: Secondary | ICD-10-CM | POA: Diagnosis not present

## 2018-09-13 DIAGNOSIS — Z01419 Encounter for gynecological examination (general) (routine) without abnormal findings: Secondary | ICD-10-CM | POA: Diagnosis not present

## 2018-09-13 DIAGNOSIS — D649 Anemia, unspecified: Secondary | ICD-10-CM | POA: Diagnosis not present

## 2018-09-18 DIAGNOSIS — Z23 Encounter for immunization: Secondary | ICD-10-CM | POA: Diagnosis not present

## 2018-11-12 ENCOUNTER — Other Ambulatory Visit: Payer: Self-pay | Admitting: Family

## 2018-11-12 MED ORDER — HYDROCHLOROTHIAZIDE 25 MG PO TABS: 25.0000 mg | ORAL_TABLET | Freq: Every day | ORAL | 1 refills | Status: DC

## 2018-11-12 MED ORDER — LOSARTAN POTASSIUM 100 MG PO TABS: 100.0000 mg | ORAL_TABLET | Freq: Every day | ORAL | 1 refills | Status: DC

## 2018-11-22 DIAGNOSIS — N920 Excessive and frequent menstruation with regular cycle: Secondary | ICD-10-CM | POA: Diagnosis not present

## 2018-11-22 DIAGNOSIS — N946 Dysmenorrhea, unspecified: Secondary | ICD-10-CM | POA: Diagnosis not present

## 2018-11-22 DIAGNOSIS — D259 Leiomyoma of uterus, unspecified: Secondary | ICD-10-CM | POA: Diagnosis not present

## 2018-11-30 ENCOUNTER — Ambulatory Visit: Payer: PRIVATE HEALTH INSURANCE | Admitting: Family

## 2018-12-06 ENCOUNTER — Other Ambulatory Visit (INDEPENDENT_AMBULATORY_CARE_PROVIDER_SITE_OTHER): Payer: 59

## 2018-12-06 ENCOUNTER — Encounter: Payer: Self-pay | Admitting: Family

## 2018-12-06 ENCOUNTER — Ambulatory Visit: Payer: 59 | Admitting: Family

## 2018-12-06 VITALS — BP 130/72 | HR 70 | Temp 98.4°F | Ht 63.0 in | Wt 213.1 lb

## 2018-12-06 DIAGNOSIS — D509 Iron deficiency anemia, unspecified: Secondary | ICD-10-CM

## 2018-12-06 DIAGNOSIS — Z113 Encounter for screening for infections with a predominantly sexual mode of transmission: Secondary | ICD-10-CM

## 2018-12-06 DIAGNOSIS — Z23 Encounter for immunization: Secondary | ICD-10-CM

## 2018-12-06 DIAGNOSIS — I10 Essential (primary) hypertension: Secondary | ICD-10-CM

## 2018-12-06 LAB — CBC WITH DIFFERENTIAL/PLATELET
BASOS ABS: 0 10*3/uL (ref 0.0–0.1)
Basophils Relative: 0.3 % (ref 0.0–3.0)
EOS ABS: 0.3 10*3/uL (ref 0.0–0.7)
Eosinophils Relative: 2.9 % (ref 0.0–5.0)
HEMATOCRIT: 36 % (ref 36.0–46.0)
Hemoglobin: 12.1 g/dL (ref 12.0–15.0)
LYMPHS PCT: 28.4 % (ref 12.0–46.0)
Lymphs Abs: 2.7 10*3/uL (ref 0.7–4.0)
MCHC: 33.6 g/dL (ref 30.0–36.0)
MCV: 78.5 fl (ref 78.0–100.0)
MONOS PCT: 6.5 % (ref 3.0–12.0)
Monocytes Absolute: 0.6 10*3/uL (ref 0.1–1.0)
Neutro Abs: 6 10*3/uL (ref 1.4–7.7)
Neutrophils Relative %: 61.9 % (ref 43.0–77.0)
Platelets: 393 10*3/uL (ref 150.0–400.0)
RBC: 4.59 Mil/uL (ref 3.87–5.11)
RDW: 15 % (ref 11.5–15.5)
WBC: 9.6 10*3/uL (ref 4.0–10.5)

## 2018-12-06 LAB — BASIC METABOLIC PANEL
BUN: 7 mg/dL (ref 6–23)
CHLORIDE: 99 meq/L (ref 96–112)
CO2: 31 meq/L (ref 19–32)
CREATININE: 0.59 mg/dL (ref 0.40–1.20)
Calcium: 9.6 mg/dL (ref 8.4–10.5)
GFR: 151.78 mL/min (ref >60.00)
Glucose, Bld: 95 mg/dL (ref 70–99)
POTASSIUM: 3.1 meq/L — AB (ref 3.5–5.1)
Sodium: 136 mEq/L (ref 135–145)

## 2018-12-06 NOTE — Progress Notes (Signed)
Cynthia Carpenter is a 32 y.o. female with the following history as recorded in EpicCare:  Patient Active Problem List   Diagnosis Date Noted  . History of unprotected sex 01/08/2013  . FH: breast cancer in first degree relative 01/08/2013  . Obesity 12/27/2012  . HIDRADENITIS SUPPURATIVA 03/30/2010  . Hypertension 03/30/2010    Current Outpatient Medications  Medication Sig Dispense Refill  . albuterol (PROVENTIL HFA;VENTOLIN HFA) 108 (90 Base) MCG/ACT inhaler Inhale 1-2 puffs into the lungs every 6 (six) hours as needed for wheezing or shortness of breath. 1 Inhaler 3  . amLODipine (NORVASC) 10 MG tablet Take 1 tablet (10 mg total) by mouth daily. 90 tablet 1  . fluticasone (FLONASE) 50 MCG/ACT nasal spray Place 2 sprays into both nostrils daily. 16 g 6  . hydrochlorothiazide (HYDRODIURIL) 25 MG tablet Take 1 tablet (25 mg total) by mouth daily. 90 tablet 1  . losartan (COZAAR) 100 MG tablet Take 1 tablet (100 mg total) by mouth daily. 90 tablet 1  . potassium chloride (K-DUR) 10 MEQ tablet Take 1 tablet (10 mEq total) by mouth daily. 90 tablet 0  . sodium chloride (OCEAN) 0.65 % SOLN nasal spray Place 1 spray into both nostrils as needed for congestion. 15 mL 0  . tranexamic acid (LYSTEDA) 650 MG TABS tablet tranexamic acid 650 mg tablet     No current facility-administered medications for this visit.     Allergies: Patient has no known allergies.  Past Medical History:  Diagnosis Date  . Asthma   . Hypertension   . Obesity     Past Surgical History:  Procedure Laterality Date  . TUBAL LIGATION      Family History  Problem Relation Age of Onset  . Arthritis Mother   . Cancer Mother   . Hyperlipidemia Mother   . Hyperthyroidism Mother   . Alcohol abuse Father   . Hypertension Father   . Diabetes Brother   . Cancer Maternal Grandmother   . Hyperlipidemia Maternal Grandmother   . Hypertension Maternal Grandmother   . Depression Paternal Grandmother     Social  History   Tobacco Use  . Smoking status: Never Smoker  . Smokeless tobacco: Never Used  Substance Use Topics  . Alcohol use: Yes    Comment: socially    Subjective:  Patient presents for 6 month follow-up on hypertension; doing well on Losartan/ HCTZ and Amlodipine; Denies any chest pain, shortness of breath, blurred vision or headache.  Also requesting STD screen; denies any concerns for exposure; no vaginal discharge;  LMP- 11/11/2018;    Objective:  Vitals:   12/06/18 1445  BP: 130/72  Pulse: 70  Temp: 98.4 F (36.9 C)  TempSrc: Oral  SpO2: 99%  Weight: 213 lb 1.3 oz (96.7 kg)  Height: 5\' 3"  (1.6 m)    General: Well developed, well nourished, in no acute distress  Skin : Warm and dry.  Head: Normocephalic and atraumatic  Lungs: Respirations unlabored; clear to auscultation bilaterally without wheeze, rales, rhonchi  CVS exam: normal rate and regular rhythm.  Neurologic: Alert and oriented; speech intact; face symmetrical; moves all extremities well; CNII-XII intact without focal deficit   Assessment:  1. Essential hypertension   2. Screen for STD (sexually transmitted disease)   3. Iron deficiency anemia, unspecified iron deficiency anemia type     Plan:  1. Stable; check BMP today; continue same medications; 2. Update labs per patient request; 3. Update labs- periods are improved on Lysteda; will  continue with her GYN;   No follow-ups on file.  Orders Placed This Encounter  Procedures  . GC/Chlamydia Probe Amp([facility])    Standing Status:   Future    Standing Expiration Date:   12/07/2019  . Basic Metabolic Panel (BMET)    Standing Status:   Future    Standing Expiration Date:   12/06/2019  . HIV Antibody (routine testing w rflx)    Standing Status:   Future    Standing Expiration Date:   12/07/2019  . RPR    Standing Status:   Future    Standing Expiration Date:   12/06/2019  . CBC w/Diff    Standing Status:   Future    Standing Expiration Date:    12/06/2019  . Iron, TIBC and Ferritin Panel    Standing Status:   Future    Standing Expiration Date:   12/07/2019    Requested Prescriptions    No prescriptions requested or ordered in this encounter

## 2018-12-07 LAB — IRON,TIBC AND FERRITIN PANEL
%SAT: 17 % (ref 16–45)
Ferritin: 19 ng/mL (ref 16–154)
Iron: 69 ug/dL (ref 40–190)
TIBC: 399 ug/dL (ref 250–450)

## 2018-12-07 LAB — RPR: RPR Ser Ql: NONREACTIVE

## 2018-12-07 LAB — HIV ANTIBODY (ROUTINE TESTING W REFLEX): HIV: NONREACTIVE

## 2018-12-08 LAB — GC/CHLAMYDIA PROBE AMP
Chlamydia trachomatis, NAA: NEGATIVE
Neisseria gonorrhoeae by PCR: NEGATIVE

## 2018-12-17 ENCOUNTER — Other Ambulatory Visit: Payer: Self-pay | Admitting: Family

## 2018-12-17 MED ORDER — POTASSIUM CHLORIDE ER 10 MEQ PO TBCR: 10.0000 meq | EXTENDED_RELEASE_TABLET | Freq: Every day | ORAL | 3 refills | Status: DC

## 2019-02-14 ENCOUNTER — Encounter: Payer: Self-pay | Admitting: Family

## 2019-02-14 ENCOUNTER — Other Ambulatory Visit: Payer: Self-pay | Admitting: Family

## 2019-02-14 MED ORDER — LOSARTAN POTASSIUM-HCTZ 100-25 MG PO TABS: 1.0000 | ORAL_TABLET | Freq: Every day | ORAL | 1 refills | Status: DC

## 2019-03-15 ENCOUNTER — Other Ambulatory Visit: Payer: Self-pay | Admitting: Family

## 2019-03-15 MED ORDER — AMLODIPINE BESYLATE 10 MG PO TABS: 10.0000 mg | ORAL_TABLET | Freq: Every day | ORAL | 0 refills | Status: DC

## 2019-03-27 ENCOUNTER — Encounter: Payer: Self-pay | Admitting: Family

## 2019-03-27 ENCOUNTER — Ambulatory Visit: Payer: Self-pay | Admitting: Family

## 2019-03-27 ENCOUNTER — Ambulatory Visit (INDEPENDENT_AMBULATORY_CARE_PROVIDER_SITE_OTHER): Payer: 59 | Admitting: Family

## 2019-03-27 DIAGNOSIS — R05 Cough: Secondary | ICD-10-CM | POA: Diagnosis not present

## 2019-03-27 DIAGNOSIS — R509 Fever, unspecified: Secondary | ICD-10-CM

## 2019-03-27 DIAGNOSIS — Z20828 Contact with and (suspected) exposure to other viral communicable diseases: Secondary | ICD-10-CM

## 2019-03-27 DIAGNOSIS — R059 Cough, unspecified: Secondary | ICD-10-CM

## 2019-03-27 MED ORDER — FLUTICASONE FUROATE-VILANTEROL 100-25 MCG/INH IN AEPB: 1.0000 | INHALATION_SPRAY | Freq: Once | RESPIRATORY_TRACT | 0 refills | Status: DC

## 2019-03-27 NOTE — Telephone Encounter (Signed)
Appointment has been made

## 2019-03-27 NOTE — Telephone Encounter (Signed)
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 and this morning she was sent home with cough and fever 100.2. Has asthma and has a " little shortness of breath." Warm transferred to [place] for an appointment. Answer Assessment - Initial Assessment Questions 1. COVID-19 DIAGNOSIS: "Who made your Coronavirus (COVID-19) diagnosis?" "Was it confirmed by a positive lab test?" If not diagnosed by a HCP, ask "Are there lots of cases (community spread) where you live?" (See public health department website, if unsure)   * MAJOR community spread: high number of cases; numbers of cases are increasing; many people hospitalized.   * MINOR community spread: low number of cases; not increasing; few or no people hospitalized     No diagnosis 2. ONSET: "When did the COVID-19 symptoms start?"      Started - cough, yesterday 3. WORST SYMPTOM: "What is your worst symptom?" (e.g., cough, fever, shortness of breath, muscle aches)     Fever today 100.2 4. COUGH: "How bad is the cough?"       Cough is worse with yellow mucus 5. FEVER: "Do you have a fever?" If so, ask: "What is your temperature, how was it measured, and when did it start?"     Yes - 100.2 6. RESPIRATORY STATUS: "Describe your breathing?" (e.g., shortness of breath, wheezing, unable to speak)      Has asthma - a little short of breath 7. BETTER-SAME-WORSE: "Are you getting better, staying the same or getting worse compared to yesterday?"  If getting worse, ask, "In what way?"     A little worse today 8. HIGH RISK DISEASE: "Do you have any chronic medical problems?" (e.g., asthma, heart or lung disease, weak immune system, etc.)     Asthma 9. PREGNANCY: "Is there any chance you are pregnant?" "When was your last menstrual period?"     No 10. OTHER SYMPTOMS: "Do you have any other symptoms?"  (e.g., runny nose, headache, sore throat, loss of smell)       Headache  Protocols used: CORONAVIRUS (COVID-19) DIAGNOSED OR  SUSPECTED-A-AH

## 2019-03-27 NOTE — Progress Notes (Signed)
Cynthia Carpenter is a 33 y.o. female with the following history as recorded in EpicCare:  Patient Active Problem List   Diagnosis Date Noted  . History of unprotected sex 01/08/2013  . FH: breast cancer in first degree relative 01/08/2013  . Obesity 12/27/2012  . HIDRADENITIS SUPPURATIVA 03/30/2010  . Hypertension 03/30/2010    Current Outpatient Medications  Medication Sig Dispense Refill  . albuterol (PROVENTIL HFA;VENTOLIN HFA) 108 (90 Base) MCG/ACT inhaler Inhale 1-2 puffs into the lungs every 6 (six) hours as needed for wheezing or shortness of breath. 1 Inhaler 3  . amLODipine (NORVASC) 10 MG tablet Take 1 tablet (10 mg total) by mouth daily. 90 tablet 0  . fluticasone (FLONASE) 50 MCG/ACT nasal spray Place 2 sprays into both nostrils daily. 16 g 6  . fluticasone furoate-vilanterol (BREO ELLIPTA) 100-25 MCG/INH AEPB Inhale 1 puff into the lungs once for 1 dose. 1 each 0  . losartan-hydrochlorothiazide (HYZAAR) 100-25 MG tablet Take 1 tablet by mouth daily. 90 tablet 1  . potassium chloride (K-DUR) 10 MEQ tablet Take 1 tablet (10 mEq total) by mouth daily. 90 tablet 3  . sodium chloride (OCEAN) 0.65 % SOLN nasal spray Place 1 spray into both nostrils as needed for congestion. 15 mL 0  . tranexamic acid (LYSTEDA) 650 MG TABS tablet tranexamic acid 650 mg tablet     No current facility-administered medications for this visit.     Allergies: Patient has no known allergies.  Past Medical History:  Diagnosis Date  . Asthma   . Hypertension   . Obesity     Past Surgical History:  Procedure Laterality Date  . TUBAL LIGATION      Family History  Problem Relation Age of Onset  . Arthritis Mother   . Cancer Mother   . Hyperlipidemia Mother   . Hyperthyroidism Mother   . Alcohol abuse Father   . Hypertension Father   . Diabetes Brother   . Cancer Maternal Grandmother   . Hyperlipidemia Maternal Grandmother   . Hypertension Maternal Grandmother   . Depression Paternal  Grandmother     Social History   Tobacco Use  . Smoking status: Never Smoker  . Smokeless tobacco: Never Used  Substance Use Topics  . Alcohol use: Yes    Comment: socially    Subjective:   I connected with Cynthia Carpenter on 03/27/19 at 10:00 AM EDT by a video enabled telemedicine application and verified that I am speaking with the correct person using two identifiers.   I discussed the limitations of evaluation and management by telemedicine and the availability of in person appointments. The patient expressed understanding and agreed to proceed.  Patient notes that she started yesterday with a cough; she thought was related to her allergies/ asthma; she works as a Education officer, museum at [employer]. As part of routine testing at work today, she was found to have a temperature of 100.2 and was sent home. She was asked to follow-up with her PCP to determine if she should be tested for COVID-19.  She denies any shortness of breath or chest pain. 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  General: Well developed, well nourished, in no acute distress  Skin : Warm and dry.  Head: Normocephalic and atraumatic  Lungs: Respirations unlabored; Neurologic: Alert and oriented; speech intact; face symmetrical; moves all extremities well;   Assessment:  1. Cough   2. Fever, unspecified fever cause   3. Contact with or exposure to viral disease     Plan:  Will start patient on BREO 100 mg 1 inhalation daily; she is to continue Flonase and Alavert;  Do feel that she is an appropriate candidate for COVID testing and care is coordinated with  Sharlyne Pacas at [facility] to allow patient to be tested through the Health Department. Patient understands she is not to return to work until testing has been completed- at the earliest, she can return to work on Monday assuming negative COVID testing.   25+ minutes spent coordinating care for this patient;    No follow-ups on file.  No orders of the defined types were placed in this encounter.   Requested Prescriptions   Signed Prescriptions Disp Refills  . fluticasone furoate-vilanterol (BREO ELLIPTA) 100-25 MCG/INH AEPB 1 each 0    Sig: Inhale 1 puff into the lungs once for 1 dose.

## 2019-04-21 ENCOUNTER — Other Ambulatory Visit: Payer: Self-pay | Admitting: Family

## 2019-05-30 ENCOUNTER — Ambulatory Visit: Payer: 59 | Admitting: Allergy

## 2019-05-30 ENCOUNTER — Encounter: Payer: Self-pay | Admitting: Allergy

## 2019-05-30 ENCOUNTER — Other Ambulatory Visit: Payer: Self-pay

## 2019-05-30 VITALS — BP 130/82 | HR 71 | Temp 98.2°F | Resp 18 | Ht 63.0 in | Wt 220.0 lb

## 2019-05-30 DIAGNOSIS — T781XXD Other adverse food reactions, not elsewhere classified, subsequent encounter: Secondary | ICD-10-CM | POA: Diagnosis not present

## 2019-05-30 DIAGNOSIS — J3089 Other allergic rhinitis: Secondary | ICD-10-CM | POA: Diagnosis not present

## 2019-05-30 DIAGNOSIS — T781XXA Other adverse food reactions, not elsewhere classified, initial encounter: Secondary | ICD-10-CM | POA: Insufficient documentation

## 2019-05-30 DIAGNOSIS — J4540 Moderate persistent asthma, uncomplicated: Secondary | ICD-10-CM

## 2019-05-30 DIAGNOSIS — J310 Chronic rhinitis: Secondary | ICD-10-CM

## 2019-05-30 DIAGNOSIS — L2089 Other atopic dermatitis: Secondary | ICD-10-CM | POA: Insufficient documentation

## 2019-05-30 DIAGNOSIS — H1013 Acute atopic conjunctivitis, bilateral: Secondary | ICD-10-CM | POA: Insufficient documentation

## 2019-05-30 MED ORDER — ALBUTEROL SULFATE HFA 108 (90 BASE) MCG/ACT IN AERS: 1.0000 | INHALATION_SPRAY | Freq: Four times a day (QID) | RESPIRATORY_TRACT | 1 refills | Status: DC | PRN

## 2019-05-30 MED ORDER — FLUTICASONE PROPIONATE 50 MCG/ACT NA SUSP: 1.0000 | Freq: Two times a day (BID) | NASAL | 6 refills | Status: DC

## 2019-05-30 MED ORDER — MONTELUKAST SODIUM 10 MG PO TABS: 10.0000 mg | ORAL_TABLET | Freq: Every day | ORAL | 5 refills | Status: DC

## 2019-05-30 MED ORDER — OLOPATADINE HCL 0.1 % OP SOLN: 1.0000 [drp] | Freq: Two times a day (BID) | OPHTHALMIC | 5 refills | Status: DC | PRN

## 2019-05-30 NOTE — Patient Instructions (Addendum)
Today's skin testing showed:  Positive to grass, weed, ragweed, trees, cat, cockroach, dust mites.  Environmental allergies:   Start environmental control measures as below.  May use over the counter antihistamines such as Zyrtec (cetirizine), Claritin (loratadine), Allegra (fexofenadine), or Xyzal (levocetirizine) daily as needed.  Continue Flonase 1 spray twice a day.  Nasal saline spray (i.e., Simply Saline) or nasal saline lavage (i.e., NeilMed) is recommended as needed and prior to medicated nasal sprays.  May use Pazeo 1 drop in each eye daily as needed for itchy/watery eyes.   Start singulair 10mg  daily.  Cautioned that in some children/adults can experience behavioral changes including hyperactivity, agitation, depression, sleep disturbances and suicidal ideations. These side effects are rare, but if you notice them you should notify me and discontinue Singulair (montelukast).  Had a detailed discussion with patient/family that clinical history is suggestive of allergic rhinitis, and may benefit from allergy immunotherapy (AIT). Discussed in detail regarding the dosing, schedule, side effects (mild to moderate local allergic reaction and rarely systemic allergic reactions including anaphylaxis), and benefits (significant improvement in nasal symptoms, seasonal flares of asthma) of immunotherapy with the patient. There is significant time commitment involved with allergy shots, which includes weekly immunotherapy injections for first 9-12 months and then biweekly to monthly injections for 3-5 years.   Food allergies:  Continue to avoid all dairy products for now.   Get bloodwork for dairy.   For mild symptoms you can take over the counter antihistamines such as Benadryl and monitor symptoms closely. If symptoms worsen or if you have severe symptoms including breathing issues, throat closure, significant swelling, whole body hives, severe diarrhea and vomiting, lightheadedness then  seek immediate medical care.  Asthma: . Daily controller medication(s): continue Breo 100 1 puff daily and rinse mouth afterwards.  . Prior to physical activity: May use albuterol rescue inhaler 2 puffs 5 to 15 minutes prior to strenuous physical activities. Marland Kitchen Rescue medications: May use albuterol rescue inhaler 2 puffs or nebulizer every 4 to 6 hours as needed for shortness of breath, chest tightness, coughing, and wheezing. Monitor frequency of use.  . Asthma control goals:  o Full participation in all desired activities (may need albuterol before activity) o Albuterol use two times or less a week on average (not counting use with activity) o Cough interfering with sleep two times or less a month o Oral steroids no more than once a year o No hospitalizations Skin:  Start proper skin care as below.  Take pictures when the skin flares.  Schedule for patch testing.   Follow up in 2 months Skin care recommendations  Bath time: . Always use lukewarm water. AVOID very hot or cold water. Marland Kitchen Keep bathing time to 5-10 minutes. . Do NOT use bubble bath. . Use a mild soap and use just enough to wash the dirty areas. . Do NOT scrub skin vigorously.  . After bathing, pat dry your skin with a towel. Do NOT rub or scrub the skin.  Moisturizers and prescriptions:  . ALWAYS apply moisturizers immediately after bathing (within 3 minutes). This helps to lock-in moisture. . Use the moisturizer several times a day over the whole body. Kermit Balo summer moisturizers include: Aveeno, CeraVe, Cetaphil. Kermit Balo winter moisturizers include: Aquaphor, Vaseline, Cerave, Cetaphil, Eucerin, Vanicream. . When using moisturizers along with medications, the moisturizer should be applied about one hour after applying the medication to prevent diluting effect of the medication or moisturize around where you applied the medications. When not  using medications, the moisturizer can be continued twice daily as  maintenance.  Laundry and clothing: . Avoid laundry products with added color or perfumes. . Use unscented hypo-allergenic laundry products such as Tide free, Cheer free & gentle, and All free and clear.  . If the skin still seems dry or sensitive, you can try double-rinsing the clothes. . Avoid tight or scratchy clothing such as wool. . Do not use fabric softeners or dyer sheets.  Reducing Pollen Exposure . Pollen seasons: trees (spring), grass (summer) and ragweed/weeds (fall). Marland Kitchen Keep windows closed in your home and car to lower pollen exposure.  Susa Simmonds air conditioning in the bedroom and throughout the house if possible.  . Avoid going out in dry windy days - especially early morning. . Pollen counts are highest between 5 - 10 AM and on dry, hot and windy days.  . Save outside activities for late afternoon or after a heavy rain, when pollen levels are lower.  . Avoid mowing of grass if you have grass pollen allergy. Marland Kitchen Be aware that pollen can also be transported indoors on people and pets.  . Dry your clothes in an automatic dryer rather than hanging them outside where they might collect pollen.  . Rinse hair and eyes before bedtime. Pet Allergen Avoidance: . Contrary to popular opinion, there are no "hypoallergenic" breeds of dogs or cats. That is because people are not allergic to an animal's hair, but to an allergen found in the animal's saliva, dander (dead skin flakes) or urine. Pet allergy symptoms typically occur within minutes. For some people, symptoms can build up and become most severe 8 to 12 hours after contact with the animal. People with severe allergies can experience reactions in public places if dander has been transported on the pet owners' clothing. Marland Kitchen Keeping an animal outdoors is only a partial solution, since homes with pets in the yard still have higher concentrations of animal allergens. . Before getting a pet, ask your allergist to determine if you are allergic  to animals. If your pet is already considered part of your family, try to minimize contact and keep the pet out of the bedroom and other rooms where you spend a great deal of time. . As with dust mites, vacuum carpets often or replace carpet with a hardwood floor, tile or linoleum. . High-efficiency particulate air (HEPA) cleaners can reduce allergen levels over time. . While dander and saliva are the source of cat and dog allergens, urine is the source of allergens from rabbits, hamsters, mice and [place] pigs; so ask a non-allergic family member to clean the animal's cage. . If you have a pet allergy, talk to your allergist about the potential for allergy immunotherapy (allergy shots). This strategy can often provide long-term relief. Cockroach Allergen Avoidance Cockroaches are often found in the homes of densely populated urban areas, schools or commercial buildings, but these creatures can lurk almost anywhere. This does not mean that you have a dirty house or living area. . Block all areas where roaches can enter the home. This includes crevices, wall cracks and windows.  . Cockroaches need water to survive, so fix and seal all leaky faucets and pipes. Have an exterminator go through the house when your family and pets are gone to eliminate any remaining roaches. Marland Kitchen Keep food in lidded containers and put pet food dishes away after your pets are done eating. Vacuum and sweep the floor after meals, and take out garbage and recyclables. Use  lidded garbage containers in the kitchen. Wash dishes immediately after use and clean under stoves, refrigerators or toasters where crumbs can accumulate. Wipe off the stove and other kitchen surfaces and cupboards regularly. Control of House Dust Mite Allergen . Dust mite allergens are a common trigger of allergy and asthma symptoms. While they can be found throughout the house, these microscopic creatures thrive in warm, humid environments such as bedding,  upholstered furniture and carpeting. . Because so much time is spent in the bedroom, it is essential to reduce mite levels there.  . Encase pillows, mattresses, and box springs in special allergen-proof fabric covers or airtight, zippered plastic covers.  . Bedding should be washed weekly in hot water (130 F) and dried in a hot dryer. Allergen-proof covers are available for comforters and pillows that can't be regularly washed.  Wendee Copp the allergy-proof covers every few months. Minimize clutter in the bedroom. Keep pets out of the bedroom.  Marland Kitchen Keep humidity less than 50% by using a dehumidifier or air conditioning. You can buy a humidity measuring device called a hygrometer to monitor this.  . If possible, replace carpets with hardwood, linoleum, or washable area rugs. If that's not possible, vacuum frequently with a vacuum that has a HEPA filter. . Remove all upholstered furniture and non-washable window drapes from the bedroom. . Remove all non-washable stuffed toys from the bedroom.  Wash stuffed toys weekly.

## 2019-05-30 NOTE — Assessment & Plan Note (Signed)
Diagnosed with asthma over 25 years ago and currently on Breo 100 1 puff once a day for a few months and albuterol as needed with good benefit.  Today's spirometry was normal. . Daily controller medication(s): continue Breo 100 1 puff daily and rinse mouth afterwards.  . Prior to physical activity: May use albuterol rescue inhaler 2 puffs 5 to 15 minutes prior to strenuous physical activities. Marland Kitchen Rescue medications: May use albuterol rescue inhaler 2 puffs or nebulizer every 4 to 6 hours as needed for shortness of breath, chest tightness, coughing, and wheezing. Monitor frequency of use.

## 2019-05-30 NOTE — Progress Notes (Signed)
New Patient Note  RE: Cynthia Carpenter MRN: [id number] DOB: 1986-02-08 Date of Office Visit: 05/30/2019  Referring provider: Marrian Salvage,* Primary care provider: Marrian Salvage, [place]  Chief Complaint: Allergy Testing (Milk and cheese), Nasal Congestion, and Burning Eyes  History of Present Illness: I had the pleasure of seeing Cynthia Carpenter for initial evaluation at the [facility] on 05/30/2019. She is a 33 y.o. female, who is self-referred here for the evaluation of allergies, asthma, atopic dermatitis.   Food: She reports food allergy to dairy. The reaction started to occur a few months ago, after she ate some milk with cereal. Symptoms started within minutes and was in the form of perioral tingling, clearing throat, facial erythema. Denies any hives, swelling, wheezing, diarrhea, vomiting. Denies any associated cofactors such as exertion, infection, NSAID use. The symptoms lasted for a few hours after benadryl. She was not evaluated in ED. Since this episode, she does not report other accidental exposures to dairy. She does not have access to epinephrine autoinjector.  Patient always had some GI issues after dairy consumption but never had this type of reaction to date.   Past work up includes: none.  Dietary History: patient has been eating other foods including eggs, peanut, treenuts, sesame, shellfish, seafood, soy, wheat, meats, fruits and vegetables.  She reports reading labels and avoiding dairy in diet completely.   Atopic dermatitis: Patient has noticed some erythema and pruritus with her usual products and has been switching it around.  Has history of AD and using Aveeno products.  No previous patch testing done before. Not using any topical steroid creams.   Rhinitis: She reports symptoms of nasal congestion, sneezing, rhinorrhea, itchy/burning eyes. Symptoms have been going on for 25+ years. The symptoms are present all year  around with worsening in spring and summer. Other triggers include exposure to unsure. Anosmia: no. Headache: yes. She has used Flonase, OTC eye drops, OTC antihistamines (Allegra, Claritin, Zyrtec) with some improvement in symptoms. Sinus infections: no. Previous work up includes: long time ago as a child but unsure of exact results. No previous allergy injections Previous ENT evaluation: no. Previous sinus imaging: no. Last eye exam: within the past year. History of nasal polyps: no.  Asthma: She reports symptoms of chest tightness, shortness of breath, coughing, wheezing, nocturnal awakenings for 25+ years. Current medications include Breo 100 1 puff QD x few months which help. She reports not using aerochamber with asthma inhalers. She tried the following inhalers: albuterol, Qvar. Main asthma triggers are allergies, heat. In the last month, frequency of asthma symptoms: <1x/week. Frequency of nocturnal symptoms: 0x/month. Frequency of SABA use: <1x/week. Interference with physical activity: sometimes. Sleep is undisturbed. In the last 12 months, emergency room visits/urgent care visits/doctor office visits or hospitalizations due to asthma: 1. In the last 12 months, oral steroids courses: no. Lifetime history of hospitalization for asthma: once as a child. Prior intubations: no. Asthma was diagnosed at age unknown. History of pneumonia: no. She was evaluated by allergist in the past. Smoking exposure: no. Up to date with flu vaccine: yes. History of reflux: yes.  Assessment and Plan: Tonnya is a 33 y.o. female with: Other allergic rhinitis Perennial rhinoconjunctivitis symptoms for the past 25+ years with worsening in the spring and summer.  Tried Flonase, over-the-counter antihistamines and eyedrops with some benefit.  Testing as a child but unsure of results.  Previous ENT evaluation or allergy injections.  Today's skin testing was positive to  grass, weed, ragweed, trees, cat, cockroach,  dust mites.  Start environmental control measures.  May use over the counter antihistamines such as Zyrtec (cetirizine), Claritin (loratadine), Allegra (fexofenadine), or Xyzal (levocetirizine) daily as needed.  Continue Flonase 1 spray twice a day.  Nasal saline spray (i.e., Simply Saline) or nasal saline lavage (i.e., NeilMed) is recommended as needed and prior to medicated nasal sprays.  May use Patanol 1 drop in each eye BID as needed for itchy/watery eyes. (pazeo not covered).  Start singulair 10mg  daily.  Cautioned that in some children/adults can experience behavioral changes including hyperactivity, agitation, depression, sleep disturbances and suicidal ideations. These side effects are rare, but if you notice them you should notify me and discontinue Singulair (montelukast).  Had a detailed discussion with patient/family that clinical history is suggestive of allergic rhinitis, and may benefit from allergy immunotherapy (AIT). Discussed in detail regarding the dosing, schedule, side effects (mild to moderate local allergic reaction and rarely systemic allergic reactions including anaphylaxis), and benefits (significant improvement in nasal symptoms, seasonal flares of asthma) of immunotherapy with the patient. There is significant time commitment involved with allergy shots, which includes weekly immunotherapy injections for first 9-12 months and then biweekly to monthly injections for 3-5 years.   Allergic conjunctivitis of both eyes  See assessment and plan as above for allergic rhinitis.  Moderate persistent asthma without complication Diagnosed with asthma over 25 years ago and currently on Breo 100 1 puff once a day for a few months and albuterol as needed with good benefit.  Today's spirometry was normal. . Daily controller medication(s): continue Breo 100 1 puff daily and rinse mouth afterwards.  . Prior to physical activity: May use albuterol rescue inhaler 2 puffs 5 to 15  minutes prior to strenuous physical activities. Marland Kitchen Rescue medications: May use albuterol rescue inhaler 2 puffs or nebulizer every 4 to 6 hours as needed for shortness of breath, chest tightness, coughing, and wheezing. Monitor frequency of use.   Adverse food reaction Gastrointestinal issues with dairy products in the past however more recently noted some perioral tingling, clearing throat and facial erythema after dairy ingestion.  Today skin testing was negative to dairy. The patients history suggests dairy allergy, though todays skin tests were negative despite a positive histamine control.  Food allergen skin testing has excellent negative predictive value however there is still a 5% chance that the allergy exists. Therefore, we will investigate further with serum specific IgE levels and, if negative then schedule for open graded oral food challenge. A laboratory order form has been provided for serum specific IgE against milk. Until the food allergy has been definitively ruled out, the patient is to continue meticulous avoidance of dairy.  For mild symptoms you can take over the counter antihistamines such as Benadryl and monitor symptoms closely. If symptoms worsen or if you have severe symptoms including breathing issues, throat closure, significant swelling, whole body hives, severe diarrhea and vomiting, lightheadedness then seek immediate medical care.  Other atopic dermatitis Noticed increased eczema flares despite switching personal care products.  Currently using Aveeno with good benefit.  Start proper skin care.  Take pictures when the skin flares.  Concern for contact dermatitis. Schedule for patch testing - TRUE test.   Return in about 2 months (around 07/30/2019).  Meds ordered this encounter  Medications  . olopatadine (PATANOL) 0.1 % ophthalmic solution    Sig: Place 1 drop into both eyes 2 (two) times daily as needed for allergies.  Dispense:  5 mL    Refill:  5   . fluticasone (FLONASE) 50 MCG/ACT nasal spray    Sig: Place 1 spray into both nostrils 2 (two) times a day.    Dispense:  16 g    Refill:  6  . montelukast (SINGULAIR) 10 MG tablet    Sig: Take 1 tablet (10 mg total) by mouth at bedtime.    Dispense:  30 tablet    Refill:  5  . albuterol (VENTOLIN HFA) 108 (90 Base) MCG/ACT inhaler    Sig: Inhale 1-2 puffs into the lungs every 6 (six) hours as needed for wheezing or shortness of breath.    Dispense:  18 g    Refill:  1    Please dispense ProAir HFA    Lab Orders     IgE Milk w/ Component Reflex  Other allergy screening: Medication allergy: no Hymenoptera allergy: no History of recurrent infections suggestive of immunodeficency: no  Diagnostics: Spirometry:  Tracings reviewed. Her effort: Good reproducible efforts. FVC: 2.69L FEV1: 2.08L, 80% predicted FEV1/FVC ratio: 77% Interpretation: Spirometry consistent with normal pattern.  Please see scanned spirometry results for details.  Skin Testing: Environmental allergy panel and select foods. Positive test to: grass, weed, ragweed, trees, cat, cockroach, dust mites. Negative test to: common foods including dairy.  Results discussed with patient/family. Airborne Adult Perc - 05/30/19 0913    Time Antigen Placed  0913    Allergen Manufacturer  Lavella Hammock    Location  Back    Number of Test  59    Panel 1  Select    1. Control-Buffer 50% Glycerol  Negative    2. Control-Histamine 1 mg/ml  2+    3. Albumin saline  Negative    4. [place]  4+    5. [place]  4+    6. Johnson  Negative    7. [place] Blue  3+    8. Meadow Fescue  3+    9. Perennial Rye  2+    10. Sweet Vernal  Negative    11. Timothy  Negative    12. Cocklebur  Negative    13. Burweed Marshelder  Negative    14. Ragweed, short  Negative    15. Ragweed, Giant  Negative    16. Plantain,  English  3+    17. Lamb's Quarters  Negative    18. Sheep Sorrell  Negative    19. Rough Pigweed  Negative    20. Marsh  Elder, Rough  Negative    21. Mugwort, Common  Negative    22. Ash mix  2+    23. Birch mix  Negative    24. Beech American  Negative    25. Box, Elder  2+    26. Cedar, red  Negative    27. Cottonwood, Eastern  2+    28. Elm mix  Negative    29. Hickory mix  4+    30. Maple mix  Negative    31. Oak, [place] mix  Negative    32. Pecan Pollen  Negative    33. Pine mix  Negative    34. Sycamore Eastern  Negative    35. Humacao, Black Pollen  Negative    36. Alternaria alternata  Negative    37. Cladosporium Herbarum  Negative    38. Aspergillus mix  Negative    39. Penicillium mix  Negative    40. Bipolaris sorokiniana (Helminthosporium)  Negative    41.  Drechslera spicifera (Curvularia)  Negative    42. Mucor plumbeus  Negative    43. Fusarium moniliforme  Negative    44. Aureobasidium pullulans (pullulara)  Negative    45. Rhizopus oryzae  Negative    46. Botrytis cinera  Negative    47. Epicoccum nigrum  Negative    48. Phoma betae  Negative    49. Candida Albicans  Negative    50. Trichophyton mentagrophytes  Negative    51. Mite, D Farinae  5,000 AU/ml  Negative    52. Mite, D Pteronyssinus  5,000 AU/ml  Negative    53. Cat Hair 10,000 BAU/ml  Negative    54.  Dog Epithelia  Negative    55. Mixed Feathers  Negative    56. Horse Epithelia  Negative    57. Cockroach, German  Negative    58. Mouse  Negative    59. Tobacco Leaf  Negative    Other  Omitted    Other  Omitted    Comments  n     Food Perc - 05/30/19 0913    Time Antigen Placed  0913    Allergen Manufacturer  Lavella Hammock    Location  Back    Number of allergen test  10    Food  Select    1. Peanut  Negative    2. Soybean food  Negative    3. Wheat, whole  Negative    4. Sesame  Negative    5. Milk, cow  Negative    6. Egg White, chicken  Negative    7. Casein  Negative    8. Shellfish mix  Negative    9. Fish mix  Negative    10. Cashew  Negative     Intradermal - 05/30/19 0943    Time Antigen Placed   1610    Allergen Manufacturer  Lavella Hammock    Location  Arm    Number of Test  11    Intradermal  Select    Control  Negative    Johnson  4+    Ragweed mix  3+    Mold 1  Negative    Mold 2  Negative    Mold 3  Negative    Mold 4  Negative    Cat  3+    Dog  Negative    Cockroach  3+    Mite mix  3+       Past Medical History: Patient Active Problem List   Diagnosis Date Noted  . Moderate persistent asthma without complication 96/03/5408  . Other allergic rhinitis 05/30/2019  . Adverse food reaction 05/30/2019  . Other atopic dermatitis 05/30/2019  . Allergic conjunctivitis of both eyes 05/30/2019  . History of unprotected sex 01/08/2013  . FH: breast cancer in first degree relative 01/08/2013  . Obesity 12/27/2012  . HIDRADENITIS SUPPURATIVA 03/30/2010  . Hypertension 03/30/2010   Past Medical History:  Diagnosis Date  . Asthma   . Eczema   . Hypertension   . Obesity   . Urticaria    Past Surgical History: Past Surgical History:  Procedure Laterality Date  . TUBAL LIGATION    . TYMPANOSTOMY TUBE PLACEMENT     Medication List:  Current Outpatient Medications  Medication Sig Dispense Refill  . albuterol (VENTOLIN HFA) 108 (90 Base) MCG/ACT inhaler Inhale 1-2 puffs into the lungs every 6 (six) hours as needed for wheezing or shortness of breath. 18 g 1  . amLODipine (NORVASC) 10  MG tablet Take 1 tablet (10 mg total) by mouth daily. 90 tablet 0  . BREO ELLIPTA 100-25 MCG/INH AEPB INHALE 1 PUFF INTO LUNGS EVERY DAY 60 each 2  . fluticasone (FLONASE) 50 MCG/ACT nasal spray Place 1 spray into both nostrils 2 (two) times a day. 16 g 6  . losartan-hydrochlorothiazide (HYZAAR) 100-25 MG tablet Take 1 tablet by mouth daily. 90 tablet 1  . potassium chloride (K-DUR) 10 MEQ tablet Take 1 tablet (10 mEq total) by mouth daily. 90 tablet 3  . sodium chloride (OCEAN) 0.65 % SOLN nasal spray Place 1 spray into both nostrils as needed for congestion. 15 mL 0  . tranexamic acid  (LYSTEDA) 650 MG TABS tablet tranexamic acid 650 mg tablet    . montelukast (SINGULAIR) 10 MG tablet Take 1 tablet (10 mg total) by mouth at bedtime. 30 tablet 5  . olopatadine (PATANOL) 0.1 % ophthalmic solution Place 1 drop into both eyes 2 (two) times daily as needed for allergies. 5 mL 5   No current facility-administered medications for this visit.    Allergies: No Known Allergies Social History: Social History   Socioeconomic History  . Marital status: Married    Spouse name: Not on file  . Number of children: Not on file  . Years of education: Not on file  . Highest education level: Not on file  Occupational History  . Not on file  Social Needs  . Financial resource strain: Not on file  . Food insecurity    Worry: Not on file    Inability: Not on file  . Transportation needs    Medical: Not on file    Non-medical: Not on file  Tobacco Use  . Smoking status: Never Smoker  . Smokeless tobacco: Never Used  Substance and Sexual Activity  . Alcohol use: Yes    Comment: socially  . Drug use: No  . Sexual activity: Yes    Birth control/protection: Surgical    Comment: tubal ligation11/04/2014  Lifestyle  . Physical activity    Days per week: Not on file    Minutes per session: Not on file  . Stress: Not on file  Relationships  . Social Herbalist on phone: Not on file    Gets together: Not on file    Attends religious service: Not on file    Active member of club or organization: Not on file    Attends meetings of clubs or organizations: Not on file    Relationship status: Not on file  Other Topics Concern  . Not on file  Social History Narrative  . Not on file   Lives in an apartment. Smoking: denies Occupation: licensed Conservation officer, historic buildings History: Environmental education officer in the house: no Charity fundraiser in the family room: yes Carpet in the bedroom: yes Heating: gas Cooling: central Pet: no  Family History: Family History  Problem  Relation Age of Onset  . Arthritis Mother   . Cancer Mother   . Hyperlipidemia Mother   . Hyperthyroidism Mother   . Eczema Mother   . Alcohol abuse Father   . Hypertension Father   . Diabetes Brother   . Cancer Maternal Grandmother   . Hyperlipidemia Maternal Grandmother   . Hypertension Maternal Grandmother   . Depression Paternal Grandmother    Asthma, allergic rhinitis - son, brother Eczema - mother  Review of Systems  Constitutional: Negative for appetite change, chills, fever and unexpected weight change.  HENT: Positive  for congestion, rhinorrhea and sneezing.   Eyes: Positive for itching.  Respiratory: Negative for cough, chest tightness, shortness of breath and wheezing.   Cardiovascular: Negative for chest pain.  Gastrointestinal: Positive for constipation. Negative for abdominal pain.  Genitourinary: Negative for difficulty urinating.  Skin: Positive for rash.  Allergic/Immunologic: Positive for environmental allergies.  Neurological: Positive for headaches.   Objective: BP 130/82 (BP Location: Left Arm, Patient Position: Sitting, Cuff Size: Normal)   Pulse 71   Temp 98.2 F (36.8 C) (Tympanic)   Resp 18   Ht 5\' 3"  (1.6 m)   Wt 220 lb (99.8 kg)   SpO2 97%   BMI 38.97 kg/m  Body mass index is 38.97 kg/m. Physical Exam  Constitutional: She is oriented to person, place, and time. She appears well-developed and well-nourished.  HENT:  Head: Normocephalic and atraumatic.  Right Ear: External ear normal.  Left Ear: External ear normal.  Nose: Nose normal.  Mouth/Throat: Oropharynx is clear and moist.  Eyes: Conjunctivae and EOM are normal.  Neck: Neck supple.  Cardiovascular: Normal rate, regular rhythm and normal heart sounds. Exam reveals no gallop and no friction rub.  No murmur heard. Pulmonary/Chest: Effort normal and breath sounds normal. She has no wheezes. She has no rales.  Abdominal: Soft.  Neurological: She is alert and oriented to person,  place, and time.  Skin: Skin is warm. No rash noted.  Psychiatric: She has a normal mood and affect. Her behavior is normal.  Nursing note and vitals reviewed.  The plan was reviewed with the patient/family, and all questions/concerned were addressed.  It was my pleasure to see Alaiya today and participate in her care. Please feel free to contact me with any questions or concerns.  Sincerely,  Rexene Alberts, DO Allergy & Immunology  [facility] office: [phone] [facility] office: [phone]  60 minutes spent face-to-face with more than 50% of the time spent discussing food allergies, allergic rhinitis, asthma, atopic dermatitis.

## 2019-05-30 NOTE — Assessment & Plan Note (Signed)
Gastrointestinal issues with dairy products in the past however more recently noted some perioral tingling, clearing throat and facial erythema after dairy ingestion.  Today skin testing was negative to dairy. The patients history suggests dairy allergy, though todays skin tests were negative despite a positive histamine control.  Food allergen skin testing has excellent negative predictive value however there is still a 5% chance that the allergy exists. Therefore, we will investigate further with serum specific IgE levels and, if negative then schedule for open graded oral food challenge. A laboratory order form has been provided for serum specific IgE against milk. Until the food allergy has been definitively ruled out, the patient is to continue meticulous avoidance of dairy.  For mild symptoms you can take over the counter antihistamines such as Benadryl and monitor symptoms closely. If symptoms worsen or if you have severe symptoms including breathing issues, throat closure, significant swelling, whole body hives, severe diarrhea and vomiting, lightheadedness then seek immediate medical care.

## 2019-05-30 NOTE — Assessment & Plan Note (Signed)
Noticed increased eczema flares despite switching personal care products.  Currently using Aveeno with good benefit.  Start proper skin care.  Take pictures when the skin flares.  Concern for contact dermatitis. Schedule for patch testing - TRUE test.

## 2019-05-30 NOTE — Assessment & Plan Note (Signed)
   See assessment and plan as above for allergic rhinitis.  

## 2019-05-30 NOTE — Assessment & Plan Note (Addendum)
Perennial rhinoconjunctivitis symptoms for the past 25+ years with worsening in the spring and summer.  Tried Flonase, over-the-counter antihistamines and eyedrops with some benefit.  Testing as a child but unsure of results.  Previous ENT evaluation or allergy injections.  Today's skin testing was positive to grass, weed, ragweed, trees, cat, cockroach, dust mites.  Start environmental control measures.  May use over the counter antihistamines such as Zyrtec (cetirizine), Claritin (loratadine), Allegra (fexofenadine), or Xyzal (levocetirizine) daily as needed.  Continue Flonase 1 spray twice a day.  Nasal saline spray (i.e., Simply Saline) or nasal saline lavage (i.e., NeilMed) is recommended as needed and prior to medicated nasal sprays.  May use Patanol 1 drop in each eye BID as needed for itchy/watery eyes. (pazeo not covered).  Start singulair 10mg  daily.  Cautioned that in some children/adults can experience behavioral changes including hyperactivity, agitation, depression, sleep disturbances and suicidal ideations. These side effects are rare, but if you notice them you should notify me and discontinue Singulair (montelukast).  Had a detailed discussion with patient/family that clinical history is suggestive of allergic rhinitis, and may benefit from allergy immunotherapy (AIT). Discussed in detail regarding the dosing, schedule, side effects (mild to moderate local allergic reaction and rarely systemic allergic reactions including anaphylaxis), and benefits (significant improvement in nasal symptoms, seasonal flares of asthma) of immunotherapy with the patient. There is significant time commitment involved with allergy shots, which includes weekly immunotherapy injections for first 9-12 months and then biweekly to monthly injections for 3-5 years.

## 2019-06-07 ENCOUNTER — Encounter: Payer: Self-pay | Admitting: Family

## 2019-06-07 ENCOUNTER — Ambulatory Visit (INDEPENDENT_AMBULATORY_CARE_PROVIDER_SITE_OTHER): Payer: 59 | Admitting: Family

## 2019-06-07 ENCOUNTER — Other Ambulatory Visit: Payer: Self-pay

## 2019-06-07 ENCOUNTER — Other Ambulatory Visit (INDEPENDENT_AMBULATORY_CARE_PROVIDER_SITE_OTHER): Payer: 59

## 2019-06-07 VITALS — BP 128/78 | HR 65 | Temp 98.4°F | Ht 63.0 in | Wt 217.1 lb

## 2019-06-07 DIAGNOSIS — Z13220 Encounter for screening for lipoid disorders: Secondary | ICD-10-CM

## 2019-06-07 DIAGNOSIS — E559 Vitamin D deficiency, unspecified: Secondary | ICD-10-CM

## 2019-06-07 DIAGNOSIS — Z0000 Encounter for general adult medical examination without abnormal findings: Secondary | ICD-10-CM

## 2019-06-07 DIAGNOSIS — Z91018 Allergy to other foods: Secondary | ICD-10-CM | POA: Diagnosis not present

## 2019-06-07 LAB — COMPREHENSIVE METABOLIC PANEL
ALT: 15 U/L (ref 0–35)
AST: 15 U/L (ref 0–37)
Albumin: 4.2 g/dL (ref 3.5–5.2)
Alkaline Phosphatase: 73 U/L (ref 39–117)
BUN: 6 mg/dL (ref 6–23)
CO2: 27 mEq/L (ref 19–32)
Calcium: 9.4 mg/dL (ref 8.4–10.5)
Chloride: 101 mEq/L (ref 96–112)
Creatinine, Ser: 0.61 mg/dL (ref 0.40–1.20)
GFR: 136.98 mL/min (ref >60.00)
Glucose, Bld: 94 mg/dL (ref 70–99)
Potassium: 3.2 mEq/L — ABNORMAL LOW (ref 3.5–5.1)
Sodium: 138 mEq/L (ref 135–145)
Total Bilirubin: 0.5 mg/dL (ref 0.2–1.2)
Total Protein: 8 g/dL (ref 6.0–8.3)

## 2019-06-07 LAB — CBC WITH DIFFERENTIAL/PLATELET
Basophils Absolute: 0 10*3/uL (ref 0.0–0.1)
Basophils Relative: 0.3 % (ref 0.0–3.0)
Eosinophils Absolute: 0.3 10*3/uL (ref 0.0–0.7)
Eosinophils Relative: 3.2 % (ref 0.0–5.0)
HCT: 35.4 % — ABNORMAL LOW (ref 36.0–46.0)
Hemoglobin: 11.7 g/dL — ABNORMAL LOW (ref 12.0–15.0)
Lymphocytes Relative: 25.4 % (ref 12.0–46.0)
Lymphs Abs: 2 10*3/uL (ref 0.7–4.0)
MCHC: 33.1 g/dL (ref 30.0–36.0)
MCV: 78.5 fl (ref 78.0–100.0)
Monocytes Absolute: 0.4 10*3/uL (ref 0.1–1.0)
Monocytes Relative: 5.3 % (ref 3.0–12.0)
Neutro Abs: 5.2 10*3/uL (ref 1.4–7.7)
Neutrophils Relative %: 65.8 % (ref 43.0–77.0)
Platelets: 408 10*3/uL — ABNORMAL HIGH (ref 150.0–400.0)
RBC: 4.51 Mil/uL (ref 3.87–5.11)
RDW: 14.8 % (ref 11.5–15.5)
WBC: 7.9 10*3/uL (ref 4.0–10.5)

## 2019-06-07 LAB — VITAMIN D 25 HYDROXY (VIT D DEFICIENCY, FRACTURES): VITD: 12.68 ng/mL — ABNORMAL LOW (ref 30.00–100.00)

## 2019-06-07 LAB — LIPID PANEL
Cholesterol: 126 mg/dL (ref 0–200)
HDL: 44.2 mg/dL (ref >39.00)
LDL Cholesterol: 71 mg/dL (ref 0–99)
NonHDL: 82.16
Total CHOL/HDL Ratio: 3
Triglycerides: 54 mg/dL (ref 0.0–149.0)
VLDL: 10.8 mg/dL (ref 0.0–40.0)

## 2019-06-07 LAB — TSH: TSH: 1.44 u[IU]/mL (ref 0.35–4.50)

## 2019-06-07 MED ORDER — LOSARTAN POTASSIUM-HCTZ 100-25 MG PO TABS: 1.0000 | ORAL_TABLET | Freq: Every day | ORAL | 3 refills | Status: DC

## 2019-06-07 MED ORDER — AMLODIPINE BESYLATE 10 MG PO TABS: 10.0000 mg | ORAL_TABLET | Freq: Every day | ORAL | 3 refills | Status: DC

## 2019-06-07 NOTE — Progress Notes (Signed)
Cynthia Carpenter is a 33 y.o. female with the following history as recorded in EpicCare:  Patient Active Problem List   Diagnosis Date Noted  . Moderate persistent asthma without complication 56/31/4970  . Other allergic rhinitis 05/30/2019  . Adverse food reaction 05/30/2019  . Other atopic dermatitis 05/30/2019  . Allergic conjunctivitis of both eyes 05/30/2019  . History of unprotected sex 01/08/2013  . FH: breast cancer in first degree relative 01/08/2013  . Obesity 12/27/2012  . HIDRADENITIS SUPPURATIVA 03/30/2010  . Hypertension 03/30/2010    Current Outpatient Medications  Medication Sig Dispense Refill  . albuterol (VENTOLIN HFA) 108 (90 Base) MCG/ACT inhaler Inhale 1-2 puffs into the lungs every 6 (six) hours as needed for wheezing or shortness of breath. 18 g 1  . amLODipine (NORVASC) 10 MG tablet Take 1 tablet (10 mg total) by mouth daily. 90 tablet 3  . BREO ELLIPTA 100-25 MCG/INH AEPB INHALE 1 PUFF INTO LUNGS EVERY DAY 60 each 2  . fluticasone (FLONASE) 50 MCG/ACT nasal spray Place 1 spray into both nostrils 2 (two) times a day. 16 g 6  . losartan-hydrochlorothiazide (HYZAAR) 100-25 MG tablet Take 1 tablet by mouth daily. 90 tablet 3  . montelukast (SINGULAIR) 10 MG tablet Take 1 tablet (10 mg total) by mouth at bedtime. 30 tablet 5  . olopatadine (PATANOL) 0.1 % ophthalmic solution Place 1 drop into both eyes 2 (two) times daily as needed for allergies. 5 mL 5  . potassium chloride (K-DUR) 10 MEQ tablet Take 1 tablet (10 mEq total) by mouth daily. 90 tablet 3  . sodium chloride (OCEAN) 0.65 % SOLN nasal spray Place 1 spray into both nostrils as needed for congestion. 15 mL 0  . tranexamic acid (LYSTEDA) 650 MG TABS tablet tranexamic acid 650 mg tablet     No current facility-administered medications for this visit.     Allergies: Patient has no known allergies.  Past Medical History:  Diagnosis Date  . Asthma   . Eczema   . Hypertension   . Obesity   . Urticaria      Past Surgical History:  Procedure Laterality Date  . TUBAL LIGATION    . TYMPANOSTOMY TUBE PLACEMENT      Family History  Problem Relation Age of Onset  . Arthritis Mother   . Cancer Mother   . Hyperlipidemia Mother   . Hyperthyroidism Mother   . Eczema Mother   . Alcohol abuse Father   . Hypertension Father   . Diabetes Brother   . Cancer Maternal Grandmother   . Hyperlipidemia Maternal Grandmother   . Hypertension Maternal Grandmother   . Depression Paternal Grandmother     Social History   Tobacco Use  . Smoking status: Never Smoker  . Smokeless tobacco: Never Used  Substance Use Topics  . Alcohol use: Yes    Comment: socially    Subjective:  Patient presents for yearly CPE; doing well today; planning to see GYN in the next few weeks- having periods lasting 2-3 weeks/ increased pelvic pain- considering ablation vs hysterectomy.  Up to date on dental and eye exam; working with allergist;   Review of Systems  Constitutional: Negative.   HENT: Negative.   Eyes: Negative.   Respiratory: Positive for sputum production. Negative for cough, shortness of breath and wheezing.   Cardiovascular: Negative.  Negative for chest pain.  Gastrointestinal: Negative for abdominal pain.  Genitourinary: Negative for dysuria.  Musculoskeletal: Negative for myalgias.  Skin: Negative.   Neurological: Negative  for dizziness.  Psychiatric/Behavioral: Negative for depression. The patient does not have insomnia.       Objective:  Vitals:   06/07/19 0849  BP: 128/78  Pulse: 65  Temp: 98.4 F (36.9 C)  TempSrc: Oral  SpO2: 99%  Weight: 217 lb 1.9 oz (98.5 kg)  Height: 5\' 3"  (1.6 m)    General: Well developed, well nourished, in no acute distress  Skin : Warm and dry.  Head: Normocephalic and atraumatic  Eyes: Sclera and conjunctiva clear; pupils round and reactive to light; extraocular movements intact  Ears: External normal; canals clear; tympanic membranes normal   Oropharynx: Pink, supple. No suspicious lesions  Neck: Supple without thyromegaly, adenopathy  Lungs: Respirations unlabored; clear to auscultation bilaterally without wheeze, rales, rhonchi  CVS exam: normal rate and regular rhythm.  Abdomen: Soft; nontender; nondistended; normoactive bowel sounds; no masses or hepatosplenomegaly  Musculoskeletal: No deformities; no active joint inflammation  Extremities: No edema, cyanosis, clubbing  Vessels: Symmetric bilaterally  Neurologic: Alert and oriented; speech intact; face symmetrical; moves all extremities well; CNII-XII intact without focal deficit   Assessment:  1. PE (physical exam), annual     Plan:  Age appropriate preventive healthcare needs addressed; encouraged regular eye doctor and dental exams; encouraged regular exercise; will update labs and refills as needed today; follow-up to be determined; Keep planned follow-up with GYN- suspect she will need sometime of surgical intervention; keep planned follow-up with allergist.   No follow-ups on file.  No orders of the defined types were placed in this encounter.   Requested Prescriptions   Signed Prescriptions Disp Refills  . losartan-hydrochlorothiazide (HYZAAR) 100-25 MG tablet 90 tablet 3    Sig: Take 1 tablet by mouth daily.  Marland Kitchen amLODipine (NORVASC) 10 MG tablet 90 tablet 3    Sig: Take 1 tablet (10 mg total) by mouth daily.

## 2019-06-10 ENCOUNTER — Other Ambulatory Visit: Payer: Self-pay | Admitting: Family

## 2019-06-10 DIAGNOSIS — E876 Hypokalemia: Secondary | ICD-10-CM

## 2019-06-10 DIAGNOSIS — E559 Vitamin D deficiency, unspecified: Secondary | ICD-10-CM

## 2019-06-10 MED ORDER — POTASSIUM CHLORIDE CRYS ER 20 MEQ PO TBCR: 20.0000 meq | EXTENDED_RELEASE_TABLET | Freq: Every day | ORAL | 3 refills | Status: DC

## 2019-06-10 MED ORDER — VITAMIN D (ERGOCALCIFEROL) 1.25 MG (50000 UNIT) PO CAPS: 50000.0000 [IU] | ORAL_CAPSULE | ORAL | 0 refills | Status: AC

## 2019-06-12 ENCOUNTER — Encounter: Payer: Self-pay | Admitting: Family

## 2019-06-12 LAB — IGE MILK W/ COMPONENT REFLEX: F002-IgE Milk: <0.1 kU/L

## 2019-08-07 ENCOUNTER — Other Ambulatory Visit: Payer: Self-pay | Admitting: Obstetrics and Gynecology

## 2019-09-10 ENCOUNTER — Other Ambulatory Visit: Payer: Self-pay | Admitting: Family

## 2019-09-10 ENCOUNTER — Encounter: Payer: Self-pay | Admitting: Family

## 2019-09-10 DIAGNOSIS — Z113 Encounter for screening for infections with a predominantly sexual mode of transmission: Secondary | ICD-10-CM

## 2019-09-16 NOTE — Pre-Procedure Instructions (Signed)
[id number] Cynthia Carpenter, [address] [name] [address] Phone: [phone] Fax: [phone]      Your procedure is scheduled on  09-23-19  Report to [facility] Main Entrance "A" at 1130A.M., and check in at the Admitting office.  Call this number if you have problems the morning of surgery:  [phone]  Call [phone] if you have any questions prior to your surgery date Monday-Friday 8am-4pm    Remember:  Do not eat or drink after midnight the night before your surgery  Take these medicines the morning of surgery with A SIP OF WATER : montelukast (SINGULAIR)  amLODipine (NORVASC) 10 MG  BREO ELLIPTA fluticasone (FLONASE) albuterol (VENTOLIN HFA) as needed olopatadine (PATANOL)as needed sodium chloride (OCEAN) as needed  7 days prior to surgery STOP taking any Aspirin (unless otherwise instructed by your surgeon), Aleve, Naproxen, Ibuprofen, Motrin, Advil, Goody's, BC's, all herbal medications, fish oil, and all vitamins.  Bring your inhaler with you.   The Morning of Surgery  Do not wear jewelry, make-up or nail polish.  Do not wear lotions, powders, or perfumes, or deodorant  Do not shave 48 hours prior to surgery.   Do not bring valuables to the hospital.  [facility] is not responsible for any belongings or valuables.  If you are a smoker, DO NOT Smoke 24 hours prior to surgery IF you wear a CPAP at night please bring your mask, tubing, and machine the morning of surgery   Remember that you must have someone to transport you home after your surgery, and remain with you for 24 hours if you are discharged the same day.   Contacts, glasses, hearing aids, dentures or bridgework may not be worn into surgery.    Leave your suitcase in the car.  After surgery it may be brought to your room.  For patients admitted to the hospital, discharge time will be determined by your treatment team.  Patients discharged the  day of surgery will not be allowed to drive home.    Special instructions:   [facility]- Preparing For Surgery  Before surgery, you can play an important role. Because skin is not sterile, your skin needs to be as free of germs as possible. You can reduce the number of germs on your skin by washing with CHG (chlorahexidine gluconate) Soap before surgery.  CHG is an antiseptic cleaner which kills germs and bonds with the skin to continue killing germs even after washing.    Oral Hygiene is also important to reduce your risk of infection.  Remember - BRUSH YOUR TEETH THE MORNING OF SURGERY WITH YOUR REGULAR TOOTHPASTE  Please do not use if you have an allergy to CHG or antibacterial soaps. If your skin becomes reddened/irritated stop using the CHG.  Do not shave (including legs and underarms) for at least 48 hours prior to first CHG shower. It is OK to shave your face.  Please follow these instructions carefully.   1. Shower the NIGHT BEFORE SURGERY and the MORNING OF SURGERY with CHG Soap.   2. If you chose to wash your hair, wash your hair first as usual with your normal shampoo.  3. After you shampoo, rinse your hair and body thoroughly to remove the shampoo.  4. Use CHG as you would any other liquid soap. You can apply CHG directly to the skin and wash gently with a scrungie or a clean washcloth.   5. Apply the CHG Soap  to your body ONLY FROM THE NECK DOWN.  Do not use on open wounds or open sores. Avoid contact with your eyes, ears, mouth and genitals (private parts). Wash Face and genitals (private parts)  with your normal soap.   6. Wash thoroughly, paying special attention to the area where your surgery will be performed.  7. Thoroughly rinse your body with warm water from the neck down.  8. DO NOT shower/wash with your normal soap after using and rinsing off the CHG Soap.  9. Pat yourself dry with a CLEAN TOWEL.  10. Wear CLEAN PAJAMAS to bed the night before surgery, wear  comfortable clothes the morning of surgery  11. Place CLEAN SHEETS on your bed the night of your first shower and DO NOT SLEEP WITH PETS.    Day of Surgery:  Do not apply any deodorants/lotions. Please shower the morning of surgery with the CHG soap  Please wear clean clothes to the hospital/surgery center.   Remember to brush your teeth WITH YOUR REGULAR TOOTHPASTE.   Please read over the  fact sheets that you were given.

## 2019-09-17 ENCOUNTER — Encounter (HOSPITAL_COMMUNITY)
Admission: RE | Admit: 2019-09-17 | Discharge: 2019-09-17 | Disposition: A | Discharge status: Home / Self Care | Payer: 59 | Source: Ambulatory Visit | Attending: Obstetrics and Gynecology | Admitting: Obstetrics and Gynecology

## 2019-09-17 ENCOUNTER — Other Ambulatory Visit: Payer: Self-pay

## 2019-09-17 ENCOUNTER — Encounter (HOSPITAL_COMMUNITY): Payer: Self-pay

## 2019-09-17 ENCOUNTER — Other Ambulatory Visit (INDEPENDENT_AMBULATORY_CARE_PROVIDER_SITE_OTHER): Payer: 59

## 2019-09-17 DIAGNOSIS — I10 Essential (primary) hypertension: Secondary | ICD-10-CM | POA: Diagnosis not present

## 2019-09-17 DIAGNOSIS — Z01818 Encounter for other preprocedural examination: Secondary | ICD-10-CM | POA: Insufficient documentation

## 2019-09-17 DIAGNOSIS — D259 Leiomyoma of uterus, unspecified: Secondary | ICD-10-CM | POA: Diagnosis not present

## 2019-09-17 DIAGNOSIS — E876 Hypokalemia: Secondary | ICD-10-CM

## 2019-09-17 DIAGNOSIS — N946 Dysmenorrhea, unspecified: Secondary | ICD-10-CM | POA: Diagnosis not present

## 2019-09-17 DIAGNOSIS — N920 Excessive and frequent menstruation with regular cycle: Secondary | ICD-10-CM | POA: Diagnosis not present

## 2019-09-17 DIAGNOSIS — E559 Vitamin D deficiency, unspecified: Secondary | ICD-10-CM | POA: Diagnosis not present

## 2019-09-17 DIAGNOSIS — Z113 Encounter for screening for infections with a predominantly sexual mode of transmission: Secondary | ICD-10-CM

## 2019-09-17 LAB — BASIC METABOLIC PANEL
Anion gap: 13 (ref 5–15)
BUN: 9 mg/dL (ref 6–20)
BUN: 10 mg/dL (ref 6–23)
CO2: 26 mmol/L (ref 22–32)
CO2: 30 mEq/L (ref 19–32)
Calcium: 9.4 mg/dL (ref 8.9–10.3)
Calcium: 9.6 mg/dL (ref 8.4–10.5)
Chloride: 98 mmol/L (ref 98–111)
Chloride: 98 mEq/L (ref 96–112)
Creatinine, Ser: 0.93 mg/dL (ref 0.44–1.00)
Creatinine, Ser: 0.94 mg/dL (ref 0.40–1.20)
GFR calc Af Amer: >60 mL/min (ref >60)
GFR calc non Af Amer: >60 mL/min (ref >60)
GFR: 83.02 mL/min (ref >60.00)
Glucose, Bld: 105 mg/dL — ABNORMAL HIGH (ref 70–99)
Glucose, Bld: 107 mg/dL — ABNORMAL HIGH (ref 70–99)
Potassium: 3 mmol/L — ABNORMAL LOW (ref 3.5–5.1)
Potassium: 3.1 mEq/L — ABNORMAL LOW (ref 3.5–5.1)
Sodium: 137 mmol/L (ref 135–145)
Sodium: 136 mEq/L (ref 135–145)

## 2019-09-17 LAB — CBC
HCT: 36.5 % (ref 36.0–46.0)
Hemoglobin: 12.1 g/dL (ref 12.0–15.0)
MCH: 26.1 pg (ref 26.0–34.0)
MCHC: 33.2 g/dL (ref 30.0–36.0)
MCV: 78.8 fL — ABNORMAL LOW (ref 80.0–100.0)
Platelets: 413 10*3/uL — ABNORMAL HIGH (ref 150–400)
RBC: 4.63 MIL/uL (ref 3.87–5.11)
RDW: 14.6 % (ref 11.5–15.5)
WBC: 8.9 10*3/uL (ref 4.0–10.5)
nRBC: 0 % (ref 0.0–0.2)

## 2019-09-17 LAB — TYPE AND SCREEN
ABO/RH(D): A POS
Antibody Screen: NEGATIVE

## 2019-09-17 LAB — ABO/RH: ABO/RH(D): A POS

## 2019-09-17 LAB — VITAMIN D 25 HYDROXY (VIT D DEFICIENCY, FRACTURES): VITD: 21.54 ng/mL — ABNORMAL LOW (ref 30.00–100.00)

## 2019-09-17 NOTE — H&P (Addendum)
Cynthia Carpenter is a 33 y.o.  female , P: 2-0-0-2 presenting for hysterectomy because of symptomatic uterine fibroids and menorrhagia. For the past 4 years the patient has experienced heavy bleeding that will last from 7-14 days and require a pad change every hour. On many occasions she has soiled her clothing and linen and may spot daily for an entire month. She admits to cramping that she rates as 8/10 on a 10 point pain scale but finds relief by taking Ibuprofen 600 mg with Tylenol 1500 mg (was advised that this dose is excessive). During her heaviest bleeding episodes he patient experiences pain with urination and loose bowel movements. She goes on to report dyspareunia that is not positional and constipation but no vaginitis symptoms or urinary symptoms unrelated to menses. During this time she has found some relief, over past 2 months, from [place] which has kept her from soiling her clothing and allows her to only change her pad every 2 hours. A pelvic ultrasound July 2019 revealed a uterus: 7.7 x 4.3 x 4.9 cm, endometrium: 6.1 mm; #2 fibroids: anterior left sub-serosal 2.4 cm and right posterior pedunculated 2.0 cm; right ovary-4.2 cm and left ovary-3.3 cm. Patient's hemoglobin/hematocrit October 2020 was 12.1/36.5 respectively. A review of both medical and surgical management options were given to the patient however due to the disruptive and protracted nature of her symptoms, she has decided to proceed with definitive therapy in the form of hysterectomy.   Past Medical History  OB History: G: 2; P: 2-0-0-2;  SVB: 2009 and 2010 (largest infant weighed 7 lbs)  GYN History: menarche: 33 YO    LMP: see HPI    Contraception: Tubal Sterilization;   Denies history of abnormal PAP smear.  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   ROS: Admits to glasses/contact lenses, chronic nasal congestion related to allergies, loose stools with menses and constipation;   denies headache, vision changes, nasal congestion, dysphagia, tinnitus, dizziness, hoarseness, cough,  chest pain, shortness of breath, nausea, vomiting, diarrhea,  urinary frequency, urgency  dysuria, hematuria, vaginitis symptoms, pelvic pain, swelling of joints,easy bruising,  myalgias, arthralgias, skin rashes, unexplained weight loss and except as is mentioned in the history of present illness, patient's review of systems is otherwise negative.     Physical Exam  Bp: 116/70  P: 72 bpm  R: 16  Weight: 219 lbs  Height: 5\' 2"   BMI: 40.1  Neck: supple without masses or thyromegaly Lungs: clear to auscultation Heart: regular rate and rhythm Abdomen: soft, non-tender and no organomegaly Pelvic:EGBUS- wnl; vagina-normal rugae; uterus-normal size, cervix without lesions or motion tenderness; adnexae-no tenderness or masses Extremities:  no clubbing, cyanosis or edema   Assesment: Menorrhagia  Symptomatic Uterine Fibroids   Disposition:  A discussion was held with patient regarding the indication for her procedure(s) along with the risks, which include but are not limited to: reaction to anesthesia, damage to adjacent  organs, infection,  excessive bleeding and the possible need for an open abdominal incision.  The patient verbalized understanding of these risks and has consented to proceed with a Laparoscopically Assisted Vaginal Hysterectomy with Bilateral Salpingectomy at [facility] on September 23, 2019.   CSN# [id number]   [place] J. Florene Glen, PA-C  for [employer]. [name]   R/B/A reviewed with the patient and remaining questions answered.  Consent signed and witnessed.

## 2019-09-17 NOTE — Progress Notes (Addendum)
PCP - DR l MURRAY Cardiologist - na      Chest x-ray - 8/18 EKG - today -  -       Aspirin Instructions:stop     COVID TEST- for thursday   Anesthesia review:review ekg  Patient denies shortness of breath, fever, cough and chest pain at PAT appointment   Patient verbalized understanding of instructions that were given to them at the PAT appointment. Patient was also instructed that they will need to review over the PAT instructions again at home before surgery.

## 2019-09-18 ENCOUNTER — Other Ambulatory Visit: Payer: Self-pay | Admitting: Family

## 2019-09-18 LAB — RPR: RPR Ser Ql: NONREACTIVE

## 2019-09-18 LAB — HIV ANTIBODY (ROUTINE TESTING W REFLEX): HIV 1&2 Ab, 4th Generation: NONREACTIVE

## 2019-09-18 MED ORDER — VITAMIN D (ERGOCALCIFEROL) 1.25 MG (50000 UNIT) PO CAPS: 50000.0000 [IU] | ORAL_CAPSULE | ORAL | 0 refills | Status: AC

## 2019-09-18 NOTE — Anesthesia Preprocedure Evaluation (Addendum)
Anesthesia Evaluation  Patient identified by MRN, date of birth, ID band Patient awake    Reviewed: Allergy & Precautions, NPO status , Patient's Chart, lab work & pertinent test results  Airway Mallampati: II  TM Distance: >3 FB Neck ROM: Full    Dental  (+) Dental Advisory Given   Pulmonary asthma ,    Pulmonary exam normal breath sounds clear to auscultation       Cardiovascular hypertension, Pt. on medications Normal cardiovascular exam Rhythm:Regular Rate:Normal     Neuro/Psych negative neurological ROS  negative psych ROS   GI/Hepatic negative GI ROS, Neg liver ROS,   Endo/Other  negative endocrine ROS  Renal/GU negative Renal ROS     Musculoskeletal negative musculoskeletal ROS (+)   Abdominal   Peds  Hematology negative hematology ROS (+)   Anesthesia Other Findings   Reproductive/Obstetrics negative OB ROS                            Anesthesia Physical Anesthesia Plan  ASA: III  Anesthesia Plan: General   Post-op Pain Management:    Induction: Intravenous  PONV Risk Score and Plan: 4 or greater and Ondansetron, Dexamethasone, Midazolam and Treatment may vary due to age or medical condition  Airway Management Planned: Oral ETT  Additional Equipment: None  Intra-op Plan:   Post-operative Plan: Possible Post-op intubation/ventilation  Informed Consent: I have reviewed the patients History and Physical, chart, labs and discussed the procedure including the risks, benefits and alternatives for the proposed anesthesia with the patient or authorized representative who has indicated his/her understanding and acceptance.     Dental advisory given  Plan Discussed with: CRNA  Anesthesia Plan Comments: (Follows with Dr. Maudie Mercury at allergy and asthma for moderate persistent asthma, maintained on Breo and PRN albuterol. )       Anesthesia Quick Evaluation

## 2019-09-19 ENCOUNTER — Other Ambulatory Visit (HOSPITAL_COMMUNITY)
Admission: RE | Admit: 2019-09-19 | Discharge: 2019-09-19 | Disposition: A | Discharge status: Home / Self Care | Payer: 59 | Source: Ambulatory Visit | Attending: Obstetrics and Gynecology | Admitting: Obstetrics and Gynecology

## 2019-09-19 DIAGNOSIS — Z20828 Contact with and (suspected) exposure to other viral communicable diseases: Secondary | ICD-10-CM | POA: Diagnosis not present

## 2019-09-19 DIAGNOSIS — Z01812 Encounter for preprocedural laboratory examination: Secondary | ICD-10-CM | POA: Insufficient documentation

## 2019-09-19 LAB — GC/CHLAMYDIA PROBE AMP
Chlamydia trachomatis, NAA: NEGATIVE
Neisseria Gonorrhoeae by PCR: NEGATIVE

## 2019-09-20 ENCOUNTER — Other Ambulatory Visit: Payer: Self-pay | Admitting: Family

## 2019-09-20 DIAGNOSIS — E876 Hypokalemia: Secondary | ICD-10-CM

## 2019-09-20 LAB — NOVEL CORONAVIRUS, NAA (HOSP ORDER, SEND-OUT TO REF LAB; TAT 18-24 HRS): SARS-CoV-2, NAA: NOT DETECTED

## 2019-09-23 ENCOUNTER — Encounter (HOSPITAL_COMMUNITY)
Admission: RE | Disposition: A | Discharge status: Home / Self Care | Payer: Self-pay | Source: Home / Self Care | Attending: Obstetrics and Gynecology

## 2019-09-23 ENCOUNTER — Ambulatory Visit (HOSPITAL_COMMUNITY): Payer: 59 | Admitting: Anesthesiology

## 2019-09-23 ENCOUNTER — Ambulatory Visit (HOSPITAL_COMMUNITY): Payer: 59 | Admitting: Physician Assistant

## 2019-09-23 ENCOUNTER — Other Ambulatory Visit: Payer: Self-pay

## 2019-09-23 ENCOUNTER — Observation Stay (HOSPITAL_COMMUNITY)
Admission: RE | Admit: 2019-09-23 | Discharge: 2019-09-24 | Disposition: A | Discharge status: Home / Self Care | Payer: 59 | Attending: Obstetrics and Gynecology | Admitting: Obstetrics and Gynecology

## 2019-09-23 ENCOUNTER — Encounter (HOSPITAL_COMMUNITY): Payer: Self-pay

## 2019-09-23 DIAGNOSIS — N920 Excessive and frequent menstruation with regular cycle: Principal | ICD-10-CM | POA: Insufficient documentation

## 2019-09-23 DIAGNOSIS — J45909 Unspecified asthma, uncomplicated: Secondary | ICD-10-CM | POA: Diagnosis not present

## 2019-09-23 DIAGNOSIS — D251 Intramural leiomyoma of uterus: Secondary | ICD-10-CM | POA: Diagnosis not present

## 2019-09-23 DIAGNOSIS — Z79899 Other long term (current) drug therapy: Secondary | ICD-10-CM | POA: Insufficient documentation

## 2019-09-23 DIAGNOSIS — Z8249 Family history of ischemic heart disease and other diseases of the circulatory system: Secondary | ICD-10-CM | POA: Insufficient documentation

## 2019-09-23 DIAGNOSIS — I10 Essential (primary) hypertension: Secondary | ICD-10-CM | POA: Insufficient documentation

## 2019-09-23 DIAGNOSIS — E876 Hypokalemia: Secondary | ICD-10-CM | POA: Diagnosis not present

## 2019-09-23 DIAGNOSIS — D649 Anemia, unspecified: Secondary | ICD-10-CM | POA: Diagnosis not present

## 2019-09-23 DIAGNOSIS — N83201 Unspecified ovarian cyst, right side: Secondary | ICD-10-CM | POA: Diagnosis not present

## 2019-09-23 DIAGNOSIS — Z7951 Long term (current) use of inhaled steroids: Secondary | ICD-10-CM | POA: Insufficient documentation

## 2019-09-23 DIAGNOSIS — N946 Dysmenorrhea, unspecified: Secondary | ICD-10-CM | POA: Diagnosis not present

## 2019-09-23 HISTORY — PX: CYSTOSCOPY: SHX5120

## 2019-09-23 HISTORY — PX: ABDOMINAL HYSTERECTOMY: SHX81

## 2019-09-23 HISTORY — PX: LAPAROSCOPIC VAGINAL HYSTERECTOMY WITH SALPINGECTOMY: SHX6680

## 2019-09-23 LAB — POCT PREGNANCY, URINE: Preg Test, Ur: NEGATIVE

## 2019-09-23 SURGERY — HYSTERECTOMY, VAGINAL, LAPAROSCOPY-ASSISTED, WITH SALPINGECTOMY
Anesthesia: General | Site: Urethra | Laterality: Bilateral

## 2019-09-23 MED ORDER — KETOROLAC TROMETHAMINE 30 MG/ML IJ SOLN
INTRAMUSCULAR | Status: DC | PRN
  Administered 2019-09-23: 30 mg via INTRAVENOUS

## 2019-09-23 MED ORDER — DOCUSATE SODIUM 100 MG PO CAPS
100.0000 mg | ORAL_CAPSULE | Freq: Two times a day (BID) | ORAL | Status: DC
  Administered 2019-09-23 – 2019-09-24 (×2): 100 mg via ORAL
  Filled 2019-09-23 (×2): qty 1

## 2019-09-23 MED ORDER — CEFAZOLIN SODIUM-DEXTROSE 2-4 GM/100ML-% IV SOLN
2.0000 g | INTRAVENOUS | Status: AC
  Administered 2019-09-23: 14:00:00 2 g via INTRAVENOUS

## 2019-09-23 MED ORDER — LACTATED RINGERS IV SOLN
INTRAVENOUS | Status: DC
  Administered 2019-09-23: 23:00:00 via INTRAVENOUS

## 2019-09-23 MED ORDER — DEXAMETHASONE SODIUM PHOSPHATE 10 MG/ML IJ SOLN
INTRAMUSCULAR | Status: DC | PRN
  Administered 2019-09-23: 10 mg via INTRAVENOUS

## 2019-09-23 MED ORDER — POTASSIUM CHLORIDE CRYS ER 20 MEQ PO TBCR
20.0000 meq | EXTENDED_RELEASE_TABLET | Freq: Two times a day (BID) | ORAL | Status: DC
  Administered 2019-09-23 – 2019-09-24 (×2): 20 meq via ORAL
  Filled 2019-09-23 (×2): qty 1

## 2019-09-23 MED ORDER — SODIUM CHLORIDE 0.9% FLUSH: 9.0000 mL | INTRAVENOUS | Status: DC | PRN

## 2019-09-23 MED ORDER — KETOROLAC TROMETHAMINE 30 MG/ML IJ SOLN
30.0000 mg | Freq: Once | INTRAMUSCULAR | Status: AC | PRN
  Administered 2019-09-23: 30 mg via INTRAVENOUS

## 2019-09-23 MED ORDER — HYDROMORPHONE HCL 1 MG/ML IJ SOLN
0.2500 mg | INTRAMUSCULAR | Status: DC | PRN
  Administered 2019-09-23 (×2): 0.5 mg via INTRAVENOUS

## 2019-09-23 MED ORDER — BUPIVACAINE HCL (PF) 0.25 % IJ SOLN
INTRAMUSCULAR | Status: AC
  Filled 2019-09-23: qty 30

## 2019-09-23 MED ORDER — MENTHOL 3 MG MT LOZG: 1.0000 | LOZENGE | OROMUCOSAL | Status: DC | PRN

## 2019-09-23 MED ORDER — PROPOFOL 10 MG/ML IV BOLUS
INTRAVENOUS | Status: DC | PRN
  Administered 2019-09-23: 200 mg via INTRAVENOUS

## 2019-09-23 MED ORDER — ONDANSETRON HCL 4 MG/2ML IJ SOLN: 4.0000 mg | Freq: Four times a day (QID) | INTRAMUSCULAR | Status: DC | PRN

## 2019-09-23 MED ORDER — VASOPRESSIN 20 UNIT/ML IV SOLN
INTRAVENOUS | Status: DC | PRN
  Administered 2019-09-23: 9 mL via INTRAMUSCULAR
  Administered 2019-09-23: 5 mL via INTRAMUSCULAR

## 2019-09-23 MED ORDER — LACTATED RINGERS IV SOLN
INTRAVENOUS | Status: DC
  Administered 2019-09-23 (×2): via INTRAVENOUS

## 2019-09-23 MED ORDER — MIDAZOLAM HCL 2 MG/2ML IJ SOLN
INTRAMUSCULAR | Status: AC
  Filled 2019-09-23: qty 2

## 2019-09-23 MED ORDER — FLUTICASONE FUROATE-VILANTEROL 100-25 MCG/INH IN AEPB
1.0000 | INHALATION_SPRAY | Freq: Every day | RESPIRATORY_TRACT | Status: DC
  Administered 2019-09-24: 1 via RESPIRATORY_TRACT
  Filled 2019-09-23: qty 28

## 2019-09-23 MED ORDER — FLUTICASONE PROPIONATE 50 MCG/ACT NA SUSP
1.0000 | Freq: Every day | NASAL | Status: DC
  Administered 2019-09-24: 1 via NASAL
  Filled 2019-09-23: qty 16

## 2019-09-23 MED ORDER — LIDOCAINE 2% (20 MG/ML) 5 ML SYRINGE
INTRAMUSCULAR | Status: AC
  Filled 2019-09-23: qty 5

## 2019-09-23 MED ORDER — SODIUM CHLORIDE 0.9 % IR SOLN
Status: DC | PRN
  Administered 2019-09-23: 3000 mL

## 2019-09-23 MED ORDER — HYDROMORPHONE 1 MG/ML IV SOLN
INTRAVENOUS | Status: AC
  Filled 2019-09-23: qty 30

## 2019-09-23 MED ORDER — DEXAMETHASONE SODIUM PHOSPHATE 10 MG/ML IJ SOLN
INTRAMUSCULAR | Status: AC
  Filled 2019-09-23: qty 1

## 2019-09-23 MED ORDER — SUGAMMADEX SODIUM 200 MG/2ML IV SOLN
INTRAVENOUS | Status: DC | PRN
  Administered 2019-09-23: 200 mg via INTRAVENOUS

## 2019-09-23 MED ORDER — PROPOFOL 10 MG/ML IV BOLUS
INTRAVENOUS | Status: AC
  Filled 2019-09-23: qty 40

## 2019-09-23 MED ORDER — ALBUTEROL SULFATE (2.5 MG/3ML) 0.083% IN NEBU: 3.0000 mL | INHALATION_SOLUTION | Freq: Four times a day (QID) | RESPIRATORY_TRACT | Status: DC | PRN

## 2019-09-23 MED ORDER — HYDROCHLOROTHIAZIDE 25 MG PO TABS
25.0000 mg | ORAL_TABLET | Freq: Every day | ORAL | Status: DC
  Administered 2019-09-23: 25 mg via ORAL
  Filled 2019-09-23: qty 1

## 2019-09-23 MED ORDER — PROMETHAZINE HCL 25 MG/ML IJ SOLN: 6.2500 mg | INTRAMUSCULAR | Status: DC | PRN

## 2019-09-23 MED ORDER — KETOROLAC TROMETHAMINE 30 MG/ML IJ SOLN
30.0000 mg | Freq: Four times a day (QID) | INTRAMUSCULAR | Status: DC
  Administered 2019-09-24 (×3): 30 mg via INTRAVENOUS
  Filled 2019-09-23 (×3): qty 1

## 2019-09-23 MED ORDER — OXYCODONE-ACETAMINOPHEN 5-325 MG PO TABS
1.0000 | ORAL_TABLET | Freq: Four times a day (QID) | ORAL | Status: DC | PRN
  Administered 2019-09-23 – 2019-09-24 (×2): 1 via ORAL
  Filled 2019-09-23 (×3): qty 1

## 2019-09-23 MED ORDER — FENTANYL CITRATE (PF) 250 MCG/5ML IJ SOLN
INTRAMUSCULAR | Status: DC | PRN
  Administered 2019-09-23 (×2): 50 ug via INTRAVENOUS
  Administered 2019-09-23: 25 ug via INTRAVENOUS
  Administered 2019-09-23: 50 ug via INTRAVENOUS
  Administered 2019-09-23: 25 ug via INTRAVENOUS
  Administered 2019-09-23: 50 ug via INTRAVENOUS

## 2019-09-23 MED ORDER — SIMETHICONE 80 MG PO CHEW: 80.0000 mg | CHEWABLE_TABLET | Freq: Four times a day (QID) | ORAL | Status: DC | PRN

## 2019-09-23 MED ORDER — LOSARTAN POTASSIUM 50 MG PO TABS
100.0000 mg | ORAL_TABLET | Freq: Every day | ORAL | Status: DC
  Administered 2019-09-23: 100 mg via ORAL
  Filled 2019-09-23: qty 2

## 2019-09-23 MED ORDER — VASOPRESSIN 20 UNIT/ML IV SOLN
INTRAVENOUS | Status: AC
  Filled 2019-09-23: qty 1

## 2019-09-23 MED ORDER — NALOXONE HCL 0.4 MG/ML IJ SOLN: 0.4000 mg | INTRAMUSCULAR | Status: DC | PRN

## 2019-09-23 MED ORDER — LOSARTAN POTASSIUM-HCTZ 100-25 MG PO TABS: 1.0000 | ORAL_TABLET | Freq: Every day | ORAL | Status: DC

## 2019-09-23 MED ORDER — IBUPROFEN 200 MG PO TABS: 600.0000 mg | ORAL_TABLET | Freq: Four times a day (QID) | ORAL | Status: DC

## 2019-09-23 MED ORDER — HYDROMORPHONE 1 MG/ML IV SOLN
INTRAVENOUS | Status: DC
  Administered 2019-09-23: 30 mg via INTRAVENOUS

## 2019-09-23 MED ORDER — MEPERIDINE HCL 25 MG/ML IJ SOLN
INTRAMUSCULAR | Status: AC
  Administered 2019-09-23: 6.25 mg via INTRAVENOUS
  Filled 2019-09-23: qty 1

## 2019-09-23 MED ORDER — LIDOCAINE 2% (20 MG/ML) 5 ML SYRINGE
INTRAMUSCULAR | Status: DC | PRN
  Administered 2019-09-23: 100 mg via INTRAVENOUS

## 2019-09-23 MED ORDER — FENTANYL CITRATE (PF) 250 MCG/5ML IJ SOLN
INTRAMUSCULAR | Status: AC
  Filled 2019-09-23: qty 5

## 2019-09-23 MED ORDER — DIPHENHYDRAMINE HCL 50 MG/ML IJ SOLN: 12.5000 mg | Freq: Four times a day (QID) | INTRAMUSCULAR | Status: DC | PRN

## 2019-09-23 MED ORDER — AMLODIPINE BESYLATE 10 MG PO TABS: 10.0000 mg | ORAL_TABLET | Freq: Every day | ORAL | Status: DC

## 2019-09-23 MED ORDER — ONDANSETRON HCL 4 MG PO TABS: 4.0000 mg | ORAL_TABLET | Freq: Four times a day (QID) | ORAL | Status: DC | PRN

## 2019-09-23 MED ORDER — ONDANSETRON HCL 4 MG/2ML IJ SOLN
INTRAMUSCULAR | Status: AC
  Filled 2019-09-23: qty 2

## 2019-09-23 MED ORDER — ESTRADIOL 0.1 MG/GM VA CREA
TOPICAL_CREAM | VAGINAL | Status: AC
  Filled 2019-09-23: qty 42.5

## 2019-09-23 MED ORDER — CEFAZOLIN SODIUM-DEXTROSE 2-4 GM/100ML-% IV SOLN
INTRAVENOUS | Status: AC
  Filled 2019-09-23: qty 100

## 2019-09-23 MED ORDER — ONDANSETRON HCL 4 MG/2ML IJ SOLN
INTRAMUSCULAR | Status: DC | PRN
  Administered 2019-09-23: 4 mg via INTRAVENOUS

## 2019-09-23 MED ORDER — MONTELUKAST SODIUM 10 MG PO TABS
10.0000 mg | ORAL_TABLET | Freq: Every day | ORAL | Status: DC
  Administered 2019-09-23: 10 mg via ORAL
  Filled 2019-09-23: qty 1

## 2019-09-23 MED ORDER — SODIUM CHLORIDE (PF) 0.9 % IJ SOLN
INTRAMUSCULAR | Status: AC
  Filled 2019-09-23: qty 50

## 2019-09-23 MED ORDER — HYDROMORPHONE HCL 1 MG/ML IJ SOLN
INTRAMUSCULAR | Status: AC
  Administered 2019-09-23: 0.5 mg via INTRAVENOUS
  Filled 2019-09-23: qty 1

## 2019-09-23 MED ORDER — DIPHENHYDRAMINE HCL 12.5 MG/5ML PO ELIX: 12.5000 mg | ORAL_SOLUTION | Freq: Four times a day (QID) | ORAL | Status: DC | PRN

## 2019-09-23 MED ORDER — BUPIVACAINE HCL (PF) 0.25 % IJ SOLN
INTRAMUSCULAR | Status: DC | PRN
  Administered 2019-09-23: 20 mL

## 2019-09-23 MED ORDER — KETOROLAC TROMETHAMINE 30 MG/ML IJ SOLN
INTRAMUSCULAR | Status: AC
  Filled 2019-09-23: qty 1

## 2019-09-23 MED ORDER — MIDAZOLAM HCL 2 MG/2ML IJ SOLN
INTRAMUSCULAR | Status: DC | PRN
  Administered 2019-09-23: 2 mg via INTRAVENOUS

## 2019-09-23 MED ORDER — STERILE WATER FOR IRRIGATION IR SOLN
Status: DC | PRN
  Administered 2019-09-23: 1000 mL

## 2019-09-23 MED ORDER — ROCURONIUM BROMIDE 10 MG/ML (PF) SYRINGE
PREFILLED_SYRINGE | INTRAVENOUS | Status: DC | PRN
  Administered 2019-09-23: 20 mg via INTRAVENOUS
  Administered 2019-09-23: 60 mg via INTRAVENOUS

## 2019-09-23 MED ORDER — KETOROLAC TROMETHAMINE 30 MG/ML IJ SOLN: 30.0000 mg | Freq: Once | INTRAMUSCULAR | Status: AC

## 2019-09-23 MED ORDER — ROCURONIUM BROMIDE 10 MG/ML (PF) SYRINGE
PREFILLED_SYRINGE | INTRAVENOUS | Status: AC
  Filled 2019-09-23: qty 10

## 2019-09-23 MED ORDER — MEPERIDINE HCL 25 MG/ML IJ SOLN
6.2500 mg | INTRAMUSCULAR | Status: DC | PRN
  Administered 2019-09-23: 18:00:00 6.25 mg via INTRAVENOUS

## 2019-09-23 SURGICAL SUPPLY — 55 items
BARRIER ADHS 3X4 INTERCEED (GAUZE/BANDAGES/DRESSINGS) IMPLANT
CABLE HIGH FREQUENCY MONO STRZ (ELECTRODE) IMPLANT
COVER BACK TABLE 60X90IN (DRAPES) ×3 IMPLANT
COVER MAYO STAND STRL (DRAPES) ×3 IMPLANT
DECANTER SPIKE VIAL GLASS SM (MISCELLANEOUS) ×6 IMPLANT
DERMABOND ADVANCED (GAUZE/BANDAGES/DRESSINGS) ×1
DERMABOND ADVANCED .7 DNX12 (GAUZE/BANDAGES/DRESSINGS) ×2 IMPLANT
DRSG OPSITE POSTOP 3X4 (GAUZE/BANDAGES/DRESSINGS) ×3 IMPLANT
DURAPREP 26ML APPLICATOR (WOUND CARE) ×3 IMPLANT
ELECT REM PT RETURN 9FT ADLT (ELECTROSURGICAL) ×3
ELECTRODE REM PT RTRN 9FT ADLT (ELECTROSURGICAL) ×2 IMPLANT
FILTER SMOKE EVAC LAPAROSHD (FILTER) ×3 IMPLANT
FORCEPS CUTTING 33CM 5MM (CUTTING FORCEPS) ×3 IMPLANT
GAUZE 4X4 16PLY RFD (DISPOSABLE) ×3 IMPLANT
GAUZE PACKING 2X5 YD STRL (GAUZE/BANDAGES/DRESSINGS) IMPLANT
GLOVE BIO SURGEON STRL SZ7.5 (GLOVE) ×6 IMPLANT
GLOVE BIOGEL PI IND STRL 7.0 (GLOVE) ×4 IMPLANT
GLOVE BIOGEL PI IND STRL 7.5 (GLOVE) ×6 IMPLANT
GLOVE BIOGEL PI INDICATOR 7.0 (GLOVE) ×2
GLOVE BIOGEL PI INDICATOR 7.5 (GLOVE) ×3
HEMOSTAT SURGICEL 4X8 (HEMOSTASIS) IMPLANT
KIT TURNOVER KIT B (KITS) ×3 IMPLANT
LEGGING LITHOTOMY PAIR STRL (DRAPES) ×3 IMPLANT
NEEDLE INSUFFLATION 14GA 120MM (NEEDLE) ×3 IMPLANT
NEEDLE MAYO CATGUT SZ4 (NEEDLE) ×3 IMPLANT
NS IRRIG 1000ML POUR BTL (IV SOLUTION) ×3 IMPLANT
PACK LAVH (CUSTOM PROCEDURE TRAY) ×3 IMPLANT
PACK ROBOTIC GOWN (GOWN DISPOSABLE) ×3 IMPLANT
PACK TRENDGUARD 450 HYBRID PRO (MISCELLANEOUS) ×2 IMPLANT
PROTECTOR NERVE ULNAR (MISCELLANEOUS) ×6 IMPLANT
SCISSORS LAP 5X35 DISP (ENDOMECHANICALS) ×3 IMPLANT
SET CYSTO W/LG BORE CLAMP LF (SET/KITS/TRAYS/PACK) ×3 IMPLANT
SET IRRIG TUBING LAPAROSCOPIC (IRRIGATION / IRRIGATOR) ×3 IMPLANT
SET TUBE SMOKE EVAC HIGH FLOW (TUBING) ×3 IMPLANT
SHEARS HARMONIC ACE PLUS 36CM (ENDOMECHANICALS) IMPLANT
SLEEVE ENDOPATH XCEL 5M (ENDOMECHANICALS) ×6 IMPLANT
SOLUTION ELECTROLUBE (MISCELLANEOUS) ×3 IMPLANT
SPECIMEN JAR MEDIUM (MISCELLANEOUS) ×3 IMPLANT
SUT CHROMIC 2 0 SH (SUTURE) IMPLANT
SUT MON AB 4-0 PS1 27 (SUTURE) ×3 IMPLANT
SUT VIC AB 0 CT1 18XCR BRD8 (SUTURE) ×4 IMPLANT
SUT VIC AB 0 CT1 27 (SUTURE) ×1
SUT VIC AB 0 CT1 27XBRD ANBCTR (SUTURE) ×2 IMPLANT
SUT VIC AB 0 CT1 36 (SUTURE) ×3 IMPLANT
SUT VIC AB 0 CT1 8-18 (SUTURE) ×2
SUT VICRYL 0 TIES 12 18 (SUTURE) ×3 IMPLANT
SUT VICRYL 0 UR6 27IN ABS (SUTURE) ×3 IMPLANT
TOWEL GREEN STERILE FF (TOWEL DISPOSABLE) ×6 IMPLANT
TRAY FOLEY W/BAG SLVR 14FR (SET/KITS/TRAYS/PACK) ×3 IMPLANT
TRENDGUARD 450 HYBRID PRO PACK (MISCELLANEOUS) ×3
TROCAR BALLN 12MMX100 BLUNT (TROCAR) IMPLANT
TROCAR XCEL NON-BLD 11X100MML (ENDOMECHANICALS) ×3 IMPLANT
TROCAR XCEL NON-BLD 5MMX100MML (ENDOMECHANICALS) ×3 IMPLANT
UNDERPAD 30X30 (UNDERPADS AND DIAPERS) ×3 IMPLANT
WARMER LAPAROSCOPE (MISCELLANEOUS) ×3 IMPLANT

## 2019-09-23 NOTE — Anesthesia Postprocedure Evaluation (Signed)
Anesthesia Post Note  Patient: Cynthia Carpenter  Procedure(s) Performed: LAPAROSCOPIC ASSISTED VAGINAL HYSTERECTOMY WITH SALPINGECTOMY (Bilateral Abdomen) Cystoscopy (Urethra)     Patient location during evaluation: PACU Anesthesia Type: General Level of consciousness: sedated and patient cooperative Pain management: pain level controlled Vital Signs Assessment: post-procedure vital signs reviewed and stable Respiratory status: spontaneous breathing Cardiovascular status: stable Anesthetic complications: no    Last Vitals:  Vitals:   09/23/19 1811 09/23/19 1815  BP: 131/80   Pulse: 92 90  Resp: (!) 22 13  Temp:  (!) 36.2 C  SpO2: 99% 100%    Last Pain:  Vitals:   09/23/19 1713  TempSrc:   PainSc: [place]

## 2019-09-23 NOTE — Anesthesia Procedure Notes (Signed)
Procedure Name: Intubation Performed by: Valda Favia, CRNA Pre-anesthesia Checklist: Patient identified, Emergency Drugs available, Suction available, Patient being monitored and Timeout performed Patient Re-evaluated:Patient Re-evaluated prior to induction Oxygen Delivery Method: Circle system utilized Preoxygenation: Pre-oxygenation with 100% oxygen Induction Type: IV induction Ventilation: Mask ventilation without difficulty Laryngoscope Size: Mac and 4 Grade View: Grade I Tube type: Oral Tube size: 7.0 mm Number of attempts: 1 Airway Equipment and Method: Stylet Placement Confirmation: ETT inserted through vocal cords under direct vision,  positive ETCO2 and breath sounds checked- equal and bilateral Secured at: 22 cm Tube secured with: Tape Dental Injury: Teeth and Oropharynx as per pre-operative assessment  Comments: DL by Sharyl Nimrod paramedic student

## 2019-09-23 NOTE — Op Note (Signed)
Preop Diagnosis: Symptomatic Fibroid Uterus   Postop Diagnosis: Symptomatic Fibroid Uterus  Procedure: 1.LAPAROSCOPIC ASSISTED VAGINAL HYSTERECTOMY 2.BILATERAL SALPINGECTOMY 3.RT CYSTECTOMY 4.CYSTOSCOPY  Anesthesia: General   Attending: Dr. Everett Graff   Assistant:    Findings: Nl appearing uterus, cervix, bilateral fallopian tubes and rt ovarian cyst.  No endometriotic lesions noted.  Two filshie clips that were joined and not attached to the two spontaneously appeared in the cul de sac after removal of the uterus and cervix and removed through the open cuff.   Pathology: 1.uterus and cervix 2.bilateral fallopian tubes   Fluids: 1100 cc  UOP: 300 cc  EBL: 50 cc  Complications: None  Procedure:  The patient was taken to the operating room after the risks, benefits, alternatives, complications, treatment options, and expected outcomes were discussed with the patient. The patient verbalized understanding, the patient concurred with the proposed plan and consent signed and witnessed. The patient was taken to the Operating Room and identified as Cynthia Carpenter and the procedure verified as LAVH/BS.  The patient was placed under general anesthesia per anesthesia staff, the patient was placed in modified dorsal lithotomy position and was prepped, draped, and catheterized in the normal, sterile fashion.  A Time Out was held and the above information confirmed.  The cervix was visualized and an intrauterine manipulator was placed.  A 10 mm umbilical incision was then performed. Veress needle was passed and pneumoperitoneum was established. A 10 mm trocar was advanced into the intraabdominal cavity, the laparoscope was introduced and findings as noted above.  Patient was placed in trendelenburg and marcaine injected in the LLQ and a 5 mm incision was made and 5 mm trocar advanced into the intraabdominal cavity.  The same was done in the RLQ and suprapubic area.   The  Gyrus tripolar was used to excise the right fallopian tube in a sequential fashion.  The fallopian tube was removed and sent to pathology.  The same was done on the contralateral side.  The tripolar was then used to cauterize and cut the utero-ovarian ligament down to the round ligament and bladder flap created on that side.  The same was done on the contralateral side.  Bladder flap created.  Attention was then turned to the perineum after covering the abdominal trocars.  The intrauterine manipulator was removed and weighted speculum placed.  The anterior lip of the cervix was grasped with a single tooth tenaculum and the cervix injected with dilute pitressin (20u in 50cc NS).  The cervix was then circumscribed with the bovie and anterior cul-de-sac entered without difficulty.  The posterior cul-de-sac was then entered as well and uterosacral ligament on the right clamped with the heaney clamp, cut and suture ligated.  The same was done on the contralateral side.  In a sequential fashion, the paracervical tissue and parametrial tissue was clamped cut and suture ligated on the right and then on the contralateral side until the uterus was able to be removed.  The remaining pedicle on the right was clamped cut and ligated and then suture ligated.  The same was done on the contralateral side and uterus and cervix removed.  A McCall culdoplasty stitch was placed and the cuff closed with figure of eight stitches of 0 vidryl in a horizontal fashion and then [employer] stitch tied.  Cystoscopy was performed and bilateral ureters were noted to efflux without difficulty and no inadvertent bladder injuries were noted.  Attention was then returned to the abdomen after regowning and regloving.  The  laparoscope was introduced at the umbilicus and inspection performed.  The abdomen was copiously irrigated.  Good hemostasis at all pedicles and at the cuff were noted.  The suprapubic, right and left lower quadrant trocars were  removed under direct visualization and pneumoperitoneum relieved.  The [id number] umbilical trocar was then removed under direct visualization and fascia repaired with 0 vicryl.  The 32mm incision was reapproximated with 3-0 monocryl and then dermabond applied to all four incisions.    Sponge, instrument, lap and needle counts were correct.  The patient tolerated the procedure well and was awaiting extubation and transfer to the recovery room in stable condition.

## 2019-09-23 NOTE — Discharge Instructions (Signed)
Call [facility] @ [phone] if:  You have a temperature greater than or equal to 100.4 degrees Farenheit orally You have pain that is not made better by the pain medication given and taken as directed You have excessive bleeding or problems urinating  Take Colace (Docusate Sodium/Stool Softener) 100 mg 2-3 times daily while taking narcotic pain medicine to avoid constipation or until bowel movements are regular.  You may drive after 1 week You may walk up steps  You may shower  You may resume a regular diet Keep incisions clean and dry  Do not lift over 15 pounds for 6 weeks Avoid anything in vagina for 6 weeks (or until after your post-operative visit)

## 2019-09-23 NOTE — Transfer of Care (Signed)
Immediate Anesthesia Transfer of Care Note  Patient: Shelton L Novack  Procedure(s) Performed: LAPAROSCOPIC ASSISTED VAGINAL HYSTERECTOMY WITH SALPINGECTOMY (Bilateral Abdomen) Cystoscopy (Urethra)  Patient Location: PACU  Anesthesia Type:General  Level of Consciousness: awake, alert  and oriented  Airway & Oxygen Therapy: Patient Spontanous Breathing and Patient connected to face mask oxygen  Post-op Assessment: Report given to RN and Post -op Vital signs reviewed and stable  Post vital signs: Reviewed and stable  Last Vitals:  Vitals Value Taken Time  BP 108/70 09/23/19 1641  Temp    Pulse 90 09/23/19 1644  Resp 21 09/23/19 1644  SpO2 96 % 09/23/19 1644  Vitals shown include unvalidated device data.  Last Pain:  Vitals:   09/23/19 1158  TempSrc:   PainSc: 0-No pain      Patients Stated Pain Goal: 3 ([id number])  Complications: No apparent anesthesia complications

## 2019-09-24 ENCOUNTER — Encounter (HOSPITAL_COMMUNITY): Payer: Self-pay | Admitting: Obstetrics and Gynecology

## 2019-09-24 DIAGNOSIS — N920 Excessive and frequent menstruation with regular cycle: Secondary | ICD-10-CM | POA: Diagnosis not present

## 2019-09-24 LAB — CBC
HCT: 31.4 % — ABNORMAL LOW (ref 36.0–46.0)
Hemoglobin: 10.5 g/dL — ABNORMAL LOW (ref 12.0–15.0)
MCH: 25.9 pg — ABNORMAL LOW (ref 26.0–34.0)
MCHC: 33.4 g/dL (ref 30.0–36.0)
MCV: 77.5 fL — ABNORMAL LOW (ref 80.0–100.0)
Platelets: 366 10*3/uL (ref 150–400)
RBC: 4.05 MIL/uL (ref 3.87–5.11)
RDW: 14.3 % (ref 11.5–15.5)
WBC: 13.2 10*3/uL — ABNORMAL HIGH (ref 4.0–10.5)
nRBC: 0 % (ref 0.0–0.2)

## 2019-09-24 LAB — BASIC METABOLIC PANEL
Anion gap: 13 (ref 5–15)
BUN: <5 mg/dL — ABNORMAL LOW (ref 6–20)
CO2: 26 mmol/L (ref 22–32)
Calcium: 8.9 mg/dL (ref 8.9–10.3)
Chloride: 96 mmol/L — ABNORMAL LOW (ref 98–111)
Creatinine, Ser: 0.73 mg/dL (ref 0.44–1.00)
GFR calc Af Amer: >60 mL/min (ref >60)
GFR calc non Af Amer: >60 mL/min (ref >60)
Glucose, Bld: 145 mg/dL — ABNORMAL HIGH (ref 70–99)
Potassium: 3.2 mmol/L — ABNORMAL LOW (ref 3.5–5.1)
Sodium: 135 mmol/L (ref 135–145)

## 2019-09-24 MED ORDER — IBUPROFEN 600 MG PO TABS: ORAL_TABLET | ORAL | 1 refills | Status: DC

## 2019-09-24 MED ORDER — OXYCODONE-ACETAMINOPHEN 5-325 MG PO TABS: ORAL_TABLET | ORAL | 0 refills | Status: DC

## 2019-09-24 NOTE — Plan of Care (Signed)
  Problem: Pain Managment: Goal: General experience of comfort will improve Outcome: Progressing   Problem: Safety: Goal: Ability to remain free from injury will improve Outcome: Progressing   

## 2019-09-24 NOTE — Progress Notes (Signed)
Cynthia Carpenter is a32 y.o.  [id number]  Post Op Date # 1: LAVH/BS/Cystoscopy  Subjective: Patient is Doing well postoperatively. Patient has Pain is controlled with current analgesics. Medications being used: prescription NSAID's including Ketorolac 30 mg IV and narcotic analgesics including Percocet 5/325. Tolerating a regular diet, ambulating in halls without dizziness and voiding without difficulty.   Objective: Vital signs in last 24 hours: Temp:  [97 F (36.1 C)-99.1 F (37.3 C)] 98.8 F (37.1 C) (10/13 0359) Pulse Rate:  [65-96] 65 (10/13 0429) Resp:  [11-23] 14 (10/13 0426) BP: (98-144)/(60-99) 104/71 (10/13 0429) SpO2:  [87 %-100 %] 95 % (10/13 0426) Weight:  [96.6 kg] 96.6 kg (10/12 1143)  Intake/Output from previous day: 10/12 0701 - 10/13 0700 In: 1979.4 [I.V.:1979.4] Out: 4000 [Urine:3950] Intake/Output this shift: No intake/output data recorded. Recent Labs  Lab 09/17/19 0833 09/24/19 0241  WBC 8.9 13.2*  HGB 12.1 10.5*  HCT 36.5 31.4*  PLT 413* 366     Recent Labs  Lab 09/17/19 0833 09/17/19 0920 09/24/19 0241  NA 137 136 135  K 3.0* 3.1* 3.2*  CL 98 98 96*  CO2 26 30 26   BUN 9 10 <5*  CREATININE 0.93 0.94 0.73  CALCIUM 9.4 9.6 8.9  GLUCOSE 105* 107* 145*    EXAM: General: alert, cooperative, no distress and ambulating (coming from bathroom) Resp: clear to auscultation bilaterally Cardio: regular rate and rhythm, S1, S2 normal, no murmur, click, rub or gallop GI: bowel sounds present, incisions intact without evidence of inffection and umbilical dressing is clean/dry and intact. Extremities: Homans sign is negative, no sign of DVT and no calf tenderness.   Assessment: s/p Procedure(s): LAPAROSCOPIC ASSISTED VAGINAL HYSTERECTOMY WITH SALPINGECTOMY Cystoscopy: stable, progressing well, tolerating diet and anemia  Plan: Advance to PO medication Discharge home  LOS: 0 days    Earnstine Regal, PA-C 09/24/2019 7:32 AM

## 2019-09-24 NOTE — Discharge Summary (Signed)
Physician Discharge Summary  Patient ID: Cynthia Carpenter MRN: [id number] DOB/AGE: December 13, 1985 33 y.o.  Admit date: 09/23/2019 Discharge date: 09/24/2019   Discharge Diagnoses: Symptomatic Uterine Fibroids, Menorrhagia, Hypokalemia and Anemia Active Problems:   Menorrhagia   Operation: Laparoscopically Assisted Vaginal Hysterectomy, Bilateral Salpingectomy and Cystoscopy   Discharged Condition: Good  Hospital Course: On the date of admission the patient underwent the aforementioned procedures and tolerated them well.  Post operative course was unremarkable with the patient resuming bowel and bladder function by post operative day #1 and was therefore deemed ready for discharge home.  Discharge hemoglobin was 10.5 and potassium 3.2.   Disposition:   Discharge Medications:  Allergies as of 09/24/2019   No Known Allergies     Medication List    STOP taking these medications   naproxen sodium 220 MG tablet Commonly known as: ALEVE     TAKE these medications   albuterol 108 (90 Base) MCG/ACT inhaler Commonly known as: VENTOLIN HFA Inhale 1-2 puffs into the lungs every 6 (six) hours as needed for wheezing or shortness of breath.   amLODipine 10 MG tablet Commonly known as: NORVASC Take 1 tablet (10 mg total) by mouth daily.   Breo Ellipta 100-25 MCG/INH Aepb Generic drug: fluticasone furoate-vilanterol INHALE 1 PUFF INTO LUNGS EVERY DAY What changed: See the new instructions.   fluticasone 50 MCG/ACT nasal spray Commonly known as: FLONASE Place 1 spray into both nostrils 2 (two) times a day.   ibuprofen 600 MG tablet Commonly known as: ADVIL take 1 tablet po pc every 6 hours for 5 days then prn-post operative pain   losartan-hydrochlorothiazide 100-25 MG tablet Commonly known as: HYZAAR Take 1 tablet by mouth daily.   montelukast 10 MG tablet Commonly known as: SINGULAIR Take 1 tablet (10 mg total) by mouth at bedtime. What changed: when to take this    olopatadine 0.1 % ophthalmic solution Commonly known as: PATANOL Place 1 drop into both eyes 2 (two) times daily as needed for allergies.   oxyCODONE-acetaminophen 5-325 MG tablet Commonly known as: Percocet take 1 tablet po every 6 hours as needed for breakthrough post operative pain   potassium chloride SA 20 MEQ tablet Commonly known as: KLOR-CON Take 1 tablet (20 mEq total) by mouth daily. What changed: when to take this   sodium chloride 0.65 % Soln nasal spray Commonly known as: OCEAN Place 1 spray into both nostrils as needed for congestion.   Vitamin D (Ergocalciferol) 1.25 MG (50000 UT) Caps capsule Commonly known as: DRISDOL Take 1 capsule (50,000 Units total) by mouth every 7 (seven) days for 12 doses.          Follow-up: Dr. Harvie Bridge. [name] on October 14, 2019 @ 1:45 pm.     Signed: Earnstine Regal, PA-C 09/24/2019, 7:44 AM

## 2019-09-24 NOTE — Progress Notes (Signed)
Discharge paperwork and information given to pt. Pt not in distress and tolerated well. 

## 2019-09-25 LAB — SURGICAL PATHOLOGY

## 2020-01-28 ENCOUNTER — Other Ambulatory Visit: Payer: Self-pay | Admitting: Allergy

## 2020-01-31 ENCOUNTER — Telehealth: Payer: Self-pay

## 2020-01-31 MED ORDER — ALCAFTADINE 0.25 % OP SOLN: 1.0000 [drp] | Freq: Every day | OPHTHALMIC | 5 refills | Status: DC | PRN

## 2020-01-31 NOTE — Telephone Encounter (Signed)
Sent in lastacaft (alcaftadine) 1 drop per eye daily prn itchy/watery eyes.  Let patient know. Ty.

## 2020-01-31 NOTE — Telephone Encounter (Signed)
Patient informed. 

## 2020-01-31 NOTE — Addendum Note (Signed)
Addended by: Garnet Sierras on: 01/31/2020 08:59 AM   Modules accepted: Orders

## 2020-01-31 NOTE — Telephone Encounter (Signed)
Per pharmacy fax insurance no longer covers generic Patanol. The alternative given is Lastacaft. Please advise and thank you.

## 2020-03-01 ENCOUNTER — Other Ambulatory Visit: Payer: Self-pay | Admitting: Allergy

## 2020-03-27 ENCOUNTER — Other Ambulatory Visit: Payer: Self-pay | Admitting: Allergy

## 2020-04-19 ENCOUNTER — Other Ambulatory Visit: Payer: Self-pay | Admitting: Allergy

## 2020-04-24 ENCOUNTER — Telehealth: Payer: Self-pay | Admitting: Allergy

## 2020-04-24 MED ORDER — MONTELUKAST SODIUM 10 MG PO TABS: ORAL_TABLET | ORAL | 0 refills | Status: DC

## 2020-04-24 NOTE — Telephone Encounter (Signed)
Patient called and made appointment for Monday 04/27/2020 and she is out of her singulair and needs a refill called into [facility] on [address]. [phone].

## 2020-04-24 NOTE — Telephone Encounter (Signed)
Courtesy refill has been sent in. Called patient and advised. Patient verbalized understanding.

## 2020-04-26 NOTE — Progress Notes (Signed)
Follow Up Note  RE: Cynthia Carpenter MRN: [id number] DOB: 04-18-1986 Date of Office Visit: 04/27/2020  Referring provider: Marrian Salvage,* Primary care provider: Marrian Salvage, FNP  Chief Complaint: Allergic Rhinitis  and Asthma  History of Present Illness: I had the pleasure of seeing Cynthia Carpenter for a follow up visit at the [facility] on 04/27/2020. She is a 34 y.o. female, who is being followed for allergic rhino conjunctivitis, asthma, adverse food reaction, atopic dermatitis. Her previous allergy office visit was on 05/30/2019 with Dr. Maudie Mercury. Today is a regular follow up visit.  Other allergic rhinitis Patient noted PND, nasal congestion, watery eyes for the past 2-3 weeks. Prior to that was doing well.  Currently taking Singulair in the morning with no benefit. Using Flonase 1 spray per nostril at night. Using eye drops daily.   Moderate persistent asthma without complication Denies any SOB, coughing, wheezing, chest tightness, nocturnal awakenings, ER/urgent care visits or prednisone use since the last visit. Used albuterol twice the past year. Takes Breo 1001 puff daily.   Adverse food reaction Tolerates lactaid milk with no issues.   Other atopic dermatitis Having some rashes on the face and under the stomach. Using Vaseline, Cetaphil, Aquaphor as needed with good benefit.   Assessment and Plan: Cynthia Carpenter is a 34 y.o. female with: Moderate persistent asthma without complication Past history - Diagnosed with asthma over 25 years ago and currently on Breo 100 1 puff once a day for a few months and albuterol as needed with good benefit. Interim history - Doing well with below regimen.  . ACT score 24. . Today's spirometry normal.  . Daily controller medication(s): continue Breo 100 1 puff daily and rinse mouth afterwards.  . Prior to physical activity: May use albuterol rescue inhaler 2 puffs 5 to 15 minutes prior to  strenuous physical activities. Marland Kitchen Rescue medications: May use albuterol rescue inhaler 2 puffs or nebulizer every 4 to 6 hours as needed for shortness of breath, chest tightness, coughing, and wheezing. Monitor frequency of use.   Seasonal and perennial allergic rhinoconjunctivitis Past history - Perennial rhinoconjunctivitis symptoms for the past 25+ years with worsening in the spring and summer.  Tried Flonase, over-the-counter antihistamines and eyedrops with some benefit. 2020 skin testing was positive to grass, weed, ragweed, trees, cat, cockroach, dust mites. Interim history - worsening symptoms the last few weeks.   Continue environmental control measures as below.  May use over the counter antihistamines such as Zyrtec (cetirizine), Allegra (fexofenadine), or Xyzal (levocetirizine) daily as needed. May take twice a day if needed.  Samples given.   Continue Flonase 1 spray twice a day for congestion.  Start azelastine nasal spray 1-2 sprays per nostril twice a day as needed for runny nose.   Nasal saline spray (i.e., Simply Saline) or nasal saline lavage (i.e., NeilMed) is recommended as needed and prior to medicated nasal sprays.  May use Pazeo 1 drop in each eye daily as needed for itchy/watery eyes.   Continue Singulair 10mg  daily.  Read about allergy injections - handout given.   Adverse food reaction Past history - Gastrointestinal issues with dairy products in the past however more recently noted some perioral tingling, clearing throat and facial erythema after dairy ingestion. 2020 skin testing was negative to dairy. 2020 milk IgE was negative. Interim history - Tolerating lactose free milk with no issues.   Patient most likely has lactose intolerance.  May try lactose pill prior to  eating dairy products to see if it helps.   Other atopic dermatitis Past history - Noticed increased eczema flares despite switching personal care products.  Currently using Aveeno with good  benefit. Interim history - Stable but Cynthia Carpenter products do seem to flare symptoms.  Continue proper skin care as below.  Take pictures when the skin flares.  Consider patch testing in future - TRUE test.   Return in about 2 months (around 06/27/2020).  Meds ordered this encounter  Medications  . azelastine (ASTELIN) 0.1 % nasal spray    Sig: Place 1-2 sprays into both nostrils 2 (two) times daily as needed (runny nose). Use in each nostril as directed    Dispense:  30 mL    Refill:  5  . fluticasone (FLONASE) 50 MCG/ACT nasal spray    Sig: Place 1 spray into both nostrils in the morning and at bedtime. For nasal congestion    Dispense:  16 g    Refill:  5   Diagnostics: Spirometry:  Tracings reviewed. Her effort: Good reproducible efforts. FVC: 2.61L FEV1: 2.07L, 80% predicted FEV1/FVC ratio: 79% Interpretation: Spirometry consistent with normal pattern.  Please see scanned spirometry results for details.  Medication List:  Current Outpatient Medications  Medication Sig Dispense Refill  . albuterol (VENTOLIN HFA) 108 (90 Base) MCG/ACT inhaler Inhale 1-2 puffs into the lungs every 6 (six) hours as needed for wheezing or shortness of breath. 18 g 1  . Alcaftadine 0.25 % SOLN Apply 1 drop to eye daily as needed (itchy/watery eyes). 3 mL 5  . amLODipine (NORVASC) 10 MG tablet Take 1 tablet (10 mg total) by mouth daily. 90 tablet 3  . BREO ELLIPTA 100-25 MCG/INH AEPB INHALE 1 PUFF INTO LUNGS EVERY DAY (Patient taking differently: Inhale 1 puff into the lungs daily. ) 60 each 2  . fluticasone (FLONASE) 50 MCG/ACT nasal spray Place 1 spray into both nostrils in the morning and at bedtime. For nasal congestion 16 g 5  . ibuprofen (ADVIL) 600 MG tablet take 1 tablet po pc every 6 hours for 5 days then prn-post operative pain 30 tablet 1  . losartan-hydrochlorothiazide (HYZAAR) 100-25 MG tablet Take 1 tablet by mouth daily. 90 tablet 3  . montelukast (SINGULAIR) 10 MG tablet  TAKE 1 TABLET BY MOUTH EVERYDAY AT BEDTIME 30 tablet 0  . olopatadine (PATANOL) 0.1 % ophthalmic solution Place 1 drop into both eyes 2 (two) times daily as needed for allergies. 5 mL 5  . oxyCODONE-acetaminophen (PERCOCET) 5-325 MG tablet take 1 tablet po every 6 hours as needed for breakthrough post operative pain 20 tablet 0  . potassium chloride SA (K-DUR) 20 MEQ tablet Take 1 tablet (20 mEq total) by mouth daily. (Patient taking differently: Take 20 mEq by mouth 2 (two) times daily. ) 90 tablet 3  . sodium chloride (OCEAN) 0.65 % SOLN nasal spray Place 1 spray into both nostrils as needed for congestion. 15 mL 0  . azelastine (ASTELIN) 0.1 % nasal spray Place 1-2 sprays into both nostrils 2 (two) times daily as needed (runny nose). Use in each nostril as directed 30 mL 5   No current facility-administered medications for this visit.   Allergies: No Known Allergies I reviewed her past medical history, social history, family history, and environmental history and no significant changes have been reported from her previous visit.  Review of Systems  Constitutional: Negative for appetite change, chills, fever and unexpected weight change.  HENT: Positive for congestion and rhinorrhea.  Eyes: Positive for itching.  Respiratory: Negative for cough, chest tightness, shortness of breath and wheezing.   Cardiovascular: Negative for chest pain.  Gastrointestinal: Negative for abdominal pain.  Genitourinary: Negative for difficulty urinating.  Skin: Negative for rash.  Allergic/Immunologic: Positive for environmental allergies.   Objective: BP 118/72   Pulse 64   Temp 97.9 F (36.6 C) (Temporal)   Resp 16   Ht 5\' 3"  (1.6 m)   Wt 225 lb 3.2 oz (102.2 kg)   SpO2 97%   BMI 39.89 kg/m  Body mass index is 39.89 kg/m. Physical Exam  Constitutional: She is oriented to person, place, and time. She appears well-developed and well-nourished.  HENT:  Head: Normocephalic and atraumatic.    Right Ear: External ear normal.  Left Ear: External ear normal.  Nose: Nose normal.  Mouth/Throat: Oropharynx is clear and moist.  Eyes: Conjunctivae and EOM are normal.  Cardiovascular: Normal rate, regular rhythm and normal heart sounds. Exam reveals no gallop and no friction rub.  No murmur heard. Pulmonary/Chest: Effort normal and breath sounds normal. She has no wheezes. She has no rales.  Abdominal: Soft.  Musculoskeletal:     Cervical back: Neck supple.  Neurological: She is alert and oriented to person, place, and time.  Skin: Skin is warm. No rash noted.  Psychiatric: She has a normal mood and affect. Her behavior is normal.  Nursing note and vitals reviewed.  Previous notes and tests were reviewed. The plan was reviewed with the patient/family, and all questions/concerned were addressed.  It was my pleasure to see Macrina today and participate in her care. Please feel free to contact me with any questions or concerns.  Sincerely,  Rexene Alberts, DO Allergy & Immunology  [facility] office: [phone] [facility] office: [place] office: [phone]

## 2020-04-27 ENCOUNTER — Encounter: Payer: Self-pay | Admitting: Allergy

## 2020-04-27 ENCOUNTER — Ambulatory Visit (INDEPENDENT_AMBULATORY_CARE_PROVIDER_SITE_OTHER): Payer: 59 | Admitting: Allergy

## 2020-04-27 ENCOUNTER — Other Ambulatory Visit: Payer: Self-pay

## 2020-04-27 VITALS — BP 118/72 | HR 64 | Temp 97.9°F | Resp 16 | Ht 63.0 in | Wt 225.2 lb

## 2020-04-27 DIAGNOSIS — J3089 Other allergic rhinitis: Secondary | ICD-10-CM

## 2020-04-27 DIAGNOSIS — T781XXD Other adverse food reactions, not elsewhere classified, subsequent encounter: Secondary | ICD-10-CM

## 2020-04-27 DIAGNOSIS — L2089 Other atopic dermatitis: Secondary | ICD-10-CM | POA: Diagnosis not present

## 2020-04-27 DIAGNOSIS — H1010 Acute atopic conjunctivitis, unspecified eye: Secondary | ICD-10-CM

## 2020-04-27 DIAGNOSIS — J4540 Moderate persistent asthma, uncomplicated: Secondary | ICD-10-CM | POA: Diagnosis not present

## 2020-04-27 DIAGNOSIS — J302 Other seasonal allergic rhinitis: Secondary | ICD-10-CM | POA: Diagnosis not present

## 2020-04-27 MED ORDER — AZELASTINE HCL 0.1 % NA SOLN: 1.0000 | Freq: Two times a day (BID) | NASAL | 5 refills | Status: DC | PRN

## 2020-04-27 MED ORDER — FLUTICASONE PROPIONATE 50 MCG/ACT NA SUSP: 1.0000 | Freq: Two times a day (BID) | NASAL | 5 refills | Status: DC

## 2020-04-27 NOTE — Assessment & Plan Note (Signed)
Past history - Diagnosed with asthma over 25 years ago and currently on Breo 100 1 puff once a day for a few months and albuterol as needed with good benefit. Interim history - Doing well with below regimen.  . ACT score 24. . Today's spirometry normal.  . Daily controller medication(s): continue Breo 100 1 puff daily and rinse mouth afterwards.  . Prior to physical activity: May use albuterol rescue inhaler 2 puffs 5 to 15 minutes prior to strenuous physical activities. Marland Kitchen Rescue medications: May use albuterol rescue inhaler 2 puffs or nebulizer every 4 to 6 hours as needed for shortness of breath, chest tightness, coughing, and wheezing. Monitor frequency of use.

## 2020-04-27 NOTE — Assessment & Plan Note (Signed)
Past history - Perennial rhinoconjunctivitis symptoms for the past 25+ years with worsening in the spring and summer.  Tried Flonase, over-the-counter antihistamines and eyedrops with some benefit. 2020 skin testing was positive to grass, weed, ragweed, trees, cat, cockroach, dust mites. Interim history - worsening symptoms the last few weeks.   Continue environmental control measures as below.  May use over the counter antihistamines such as Zyrtec (cetirizine), Allegra (fexofenadine), or Xyzal (levocetirizine) daily as needed. May take twice a day if needed.  Samples given.   Continue Flonase 1 spray twice a day for congestion.  Start azelastine nasal spray 1-2 sprays per nostril twice a day as needed for runny nose.   Nasal saline spray (i.e., Simply Saline) or nasal saline lavage (i.e., NeilMed) is recommended as needed and prior to medicated nasal sprays.  May use Pazeo 1 drop in each eye daily as needed for itchy/watery eyes.   Continue Singulair 10mg  daily.  Read about allergy injections - handout given.

## 2020-04-27 NOTE — Patient Instructions (Addendum)
Past skin testing showed:  Positive to grass, weed, ragweed, trees, cat, cockroach, dust mites.   Continue environmental control measures as below.  May use over the counter antihistamines such as Zyrtec (cetirizine), Allegra (fexofenadine), or Xyzal (levocetirizine) daily as needed. May take twice a day if needed.  Samples given.   Continue Flonase 1 spray twice a day for congestion.  Start azelastine nasal spray 1-2 sprays per nostril twice a day as needed for runny nose.   Nasal saline spray (i.e., Simply Saline) or nasal saline lavage (i.e., NeilMed) is recommended as needed and prior to medicated nasal sprays.  May use Pazeo 1 drop in each eye daily as needed for itchy/watery eyes.   Continue singulair 10mg  daily.  Read about allergy injections.   Food:   You may try lactose pill prior to eating dairy products to see if it helps.   Asthma: . Daily controller medication(s): continue Breo 100 1 puff daily and rinse mouth afterwards.  . Prior to physical activity: May use albuterol rescue inhaler 2 puffs 5 to 15 minutes prior to strenuous physical activities. Marland Kitchen Rescue medications: May use albuterol rescue inhaler 2 puffs or nebulizer every 4 to 6 hours as needed for shortness of breath, chest tightness, coughing, and wheezing. Monitor frequency of use.  . Asthma control goals:  o Full participation in all desired activities (may need albuterol before activity) o Albuterol use two times or less a week on average (not counting use with activity) o Cough interfering with sleep two times or less a month o Oral steroids no more than once a year o No hospitalizations Skin:  Continue proper skin care as below.  Take pictures when the skin flares.  Consider patch testing in future.   Follow up in 2 months Skin care recommendations  Bath time: . Always use lukewarm water. AVOID very hot or cold water. Marland Kitchen Keep bathing time to 5-10 minutes. . Do NOT use bubble bath. . Use a  mild soap and use just enough to wash the dirty areas. . Do NOT scrub skin vigorously.  . After bathing, pat dry your skin with a towel. Do NOT rub or scrub the skin.  Moisturizers and prescriptions:  . ALWAYS apply moisturizers immediately after bathing (within 3 minutes). This helps to lock-in moisture. . Use the moisturizer several times a day over the whole body. Kermit Balo summer moisturizers include: Aveeno, CeraVe, Cetaphil. Kermit Balo winter moisturizers include: Aquaphor, Vaseline, Cerave, Cetaphil, Eucerin, Vanicream. . When using moisturizers along with medications, the moisturizer should be applied about one hour after applying the medication to prevent diluting effect of the medication or moisturize around where you applied the medications. When not using medications, the moisturizer can be continued twice daily as maintenance.  Laundry and clothing: . Avoid laundry products with added color or perfumes. . Use unscented hypo-allergenic laundry products such as Tide free, Cheer free & gentle, and All free and clear.  . If the skin still seems dry or sensitive, you can try double-rinsing the clothes. . Avoid tight or scratchy clothing such as wool. . Do not use fabric softeners or dyer sheets.  Reducing Pollen Exposure . Pollen seasons: trees (spring), grass (summer) and ragweed/weeds (fall). Marland Kitchen Keep windows closed in your home and car to lower pollen exposure.  Susa Simmonds air conditioning in the bedroom and throughout the house if possible.  . Avoid going out in dry windy days - especially early morning. . Pollen counts are highest between 5 -  10 AM and on dry, hot and windy days.  . Save outside activities for late afternoon or after a heavy rain, when pollen levels are lower.  . Avoid mowing of grass if you have grass pollen allergy. Marland Kitchen Be aware that pollen can also be transported indoors on people and pets.  . Dry your clothes in an automatic dryer rather than hanging them outside  where they might collect pollen.  . Rinse hair and eyes before bedtime. Pet Allergen Avoidance: . Contrary to popular opinion, there are no "hypoallergenic" breeds of dogs or cats. That is because people are not allergic to an animal's hair, but to an allergen found in the animal's saliva, dander (dead skin flakes) or urine. Pet allergy symptoms typically occur within minutes. For some people, symptoms can build up and become most severe 8 to 12 hours after contact with the animal. People with severe allergies can experience reactions in public places if dander has been transported on the pet owners' clothing. Marland Kitchen Keeping an animal outdoors is only a partial solution, since homes with pets in the yard still have higher concentrations of animal allergens. . Before getting a pet, ask your allergist to determine if you are allergic to animals. If your pet is already considered part of your family, try to minimize contact and keep the pet out of the bedroom and other rooms where you spend a great deal of time. . As with dust mites, vacuum carpets often or replace carpet with a hardwood floor, tile or linoleum. . High-efficiency particulate air (HEPA) cleaners can reduce allergen levels over time. . While dander and saliva are the source of cat and dog allergens, urine is the source of allergens from rabbits, hamsters, mice and [place] pigs; so ask a non-allergic family member to clean the animal's cage. . If you have a pet allergy, talk to your allergist about the potential for allergy immunotherapy (allergy shots). This strategy can often provide long-term relief. Cockroach Allergen Avoidance Cockroaches are often found in the homes of densely populated urban areas, schools or commercial buildings, but these creatures can lurk almost anywhere. This does not mean that you have a dirty house or living area. . Block all areas where roaches can enter the home. This includes crevices, wall cracks and windows.   . Cockroaches need water to survive, so fix and seal all leaky faucets and pipes. Have an exterminator go through the house when your family and pets are gone to eliminate any remaining roaches. Marland Kitchen Keep food in lidded containers and put pet food dishes away after your pets are done eating. Vacuum and sweep the floor after meals, and take out garbage and recyclables. Use lidded garbage containers in the kitchen. Wash dishes immediately after use and clean under stoves, refrigerators or toasters where crumbs can accumulate. Wipe off the stove and other kitchen surfaces and cupboards regularly. Control of House Dust Mite Allergen . Dust mite allergens are a common trigger of allergy and asthma symptoms. While they can be found throughout the house, these microscopic creatures thrive in warm, humid environments such as bedding, upholstered furniture and carpeting. . Because so much time is spent in the bedroom, it is essential to reduce mite levels there.  . Encase pillows, mattresses, and box springs in special allergen-proof fabric covers or airtight, zippered plastic covers.  . Bedding should be washed weekly in hot water (130 F) and dried in a hot dryer. Allergen-proof covers are available for comforters and pillows that can't  be regularly washed.  Wendee Copp the allergy-proof covers every few months. Minimize clutter in the bedroom. Keep pets out of the bedroom.  Marland Kitchen Keep humidity less than 50% by using a dehumidifier or air conditioning. You can buy a humidity measuring device called a hygrometer to monitor this.  . If possible, replace carpets with hardwood, linoleum, or washable area rugs. If that's not possible, vacuum frequently with a vacuum that has a HEPA filter. . Remove all upholstered furniture and non-washable window drapes from the bedroom. . Remove all non-washable stuffed toys from the bedroom.  Wash stuffed toys weekly.  Reducing Pollen Exposure . Pollen seasons: trees (spring), grass  (summer) and ragweed/weeds (fall). Marland Kitchen Keep windows closed in your home and car to lower pollen exposure.  Susa Simmonds air conditioning in the bedroom and throughout the house if possible.  . Avoid going out in dry windy days - especially early morning. . Pollen counts are highest between 5 - 10 AM and on dry, hot and windy days.  . Save outside activities for late afternoon or after a heavy rain, when pollen levels are lower.  . Avoid mowing of grass if you have grass pollen allergy. Marland Kitchen Be aware that pollen can also be transported indoors on people and pets.  . Dry your clothes in an automatic dryer rather than hanging them outside where they might collect pollen.  . Rinse hair and eyes before bedtime. Pet Allergen Avoidance: . Contrary to popular opinion, there are no "hypoallergenic" breeds of dogs or cats. That is because people are not allergic to an animal's hair, but to an allergen found in the animal's saliva, dander (dead skin flakes) or urine. Pet allergy symptoms typically occur within minutes. For some people, symptoms can build up and become most severe 8 to 12 hours after contact with the animal. People with severe allergies can experience reactions in public places if dander has been transported on the pet owners' clothing. Marland Kitchen Keeping an animal outdoors is only a partial solution, since homes with pets in the yard still have higher concentrations of animal allergens. . Before getting a pet, ask your allergist to determine if you are allergic to animals. If your pet is already considered part of your family, try to minimize contact and keep the pet out of the bedroom and other rooms where you spend a great deal of time. . As with dust mites, vacuum carpets often or replace carpet with a hardwood floor, tile or linoleum. . High-efficiency particulate air (HEPA) cleaners can reduce allergen levels over time. . While dander and saliva are the source of cat and dog allergens, urine is the source  of allergens from rabbits, hamsters, mice and [place] pigs; so ask a non-allergic family member to clean the animal's cage. . If you have a pet allergy, talk to your allergist about the potential for allergy immunotherapy (allergy shots). This strategy can often provide long-term relief. Control of House Dust Mite Allergen . Dust mite allergens are a common trigger of allergy and asthma symptoms. While they can be found throughout the house, these microscopic creatures thrive in warm, humid environments such as bedding, upholstered furniture and carpeting. . Because so much time is spent in the bedroom, it is essential to reduce mite levels there.  . Encase pillows, mattresses, and box springs in special allergen-proof fabric covers or airtight, zippered plastic covers.  . Bedding should be washed weekly in hot water (130 F) and dried in a hot dryer. Allergen-proof  covers are available for comforters and pillows that can't be regularly washed.  Wendee Copp the allergy-proof covers every few months. Minimize clutter in the bedroom. Keep pets out of the bedroom.  Marland Kitchen Keep humidity less than 50% by using a dehumidifier or air conditioning. You can buy a humidity measuring device called a hygrometer to monitor this.  . If possible, replace carpets with hardwood, linoleum, or washable area rugs. If that's not possible, vacuum frequently with a vacuum that has a HEPA filter. . Remove all upholstered furniture and non-washable window drapes from the bedroom. . Remove all non-washable stuffed toys from the bedroom.  Wash stuffed toys weekly. Cockroach Allergen Avoidance Cockroaches are often found in the homes of densely populated urban areas, schools or commercial buildings, but these creatures can lurk almost anywhere. This does not mean that you have a dirty house or living area. . Block all areas where roaches can enter the home. This includes crevices, wall cracks and windows.  . Cockroaches need water to  survive, so fix and seal all leaky faucets and pipes. Have an exterminator go through the house when your family and pets are gone to eliminate any remaining roaches. Marland Kitchen Keep food in lidded containers and put pet food dishes away after your pets are done eating. Vacuum and sweep the floor after meals, and take out garbage and recyclables. Use lidded garbage containers in the kitchen. Wash dishes immediately after use and clean under stoves, refrigerators or toasters where crumbs can accumulate. Wipe off the stove and other kitchen surfaces and cupboards regularly.

## 2020-04-27 NOTE — Assessment & Plan Note (Signed)
Past history - Noticed increased eczema flares despite switching personal care products.  Currently using Aveeno with good benefit. Interim history - Stable but [name] products do seem to flare symptoms.  Continue proper skin care as below.  Take pictures when the skin flares.  Consider patch testing in future - TRUE test.

## 2020-04-27 NOTE — Assessment & Plan Note (Signed)
Past history - Gastrointestinal issues with dairy products in the past however more recently noted some perioral tingling, clearing throat and facial erythema after dairy ingestion. 2020 skin testing was negative to dairy. 2020 milk IgE was negative. Interim history - Tolerating lactose free milk with no issues.   Patient most likely has lactose intolerance.  May try lactose pill prior to eating dairy products to see if it helps.

## 2020-05-18 ENCOUNTER — Other Ambulatory Visit: Payer: Self-pay | Admitting: Allergy

## 2020-06-16 ENCOUNTER — Other Ambulatory Visit: Payer: Self-pay | Admitting: Family

## 2020-06-16 ENCOUNTER — Telehealth: Payer: Self-pay | Admitting: Family

## 2020-06-16 MED ORDER — POTASSIUM CHLORIDE CRYS ER 20 MEQ PO TBCR: 20.0000 meq | EXTENDED_RELEASE_TABLET | Freq: Every day | ORAL | 0 refills | Status: DC

## 2020-06-16 MED ORDER — LOSARTAN POTASSIUM-HCTZ 100-25 MG PO TABS: 1.0000 | ORAL_TABLET | Freq: Every day | ORAL | 0 refills | Status: DC

## 2020-06-16 MED ORDER — BREO ELLIPTA 100-25 MCG/INH IN AEPB: INHALATION_SPRAY | RESPIRATORY_TRACT | 2 refills | Status: DC

## 2020-06-16 MED ORDER — AMLODIPINE BESYLATE 10 MG PO TABS: 10.0000 mg | ORAL_TABLET | Freq: Every day | ORAL | 0 refills | Status: DC

## 2020-06-16 NOTE — Telephone Encounter (Signed)
New message:    1.Medication Requested: potassium chloride SA (K-DUR) 20 MEQ tablet losartan-hydrochlorothiazide (HYZAAR) 100-25 MG tablet amLODipine (NORVASC) 10 MG tablet BREO ELLIPTA 100-25 MCG/INH AEPB 2. Pharmacy (Name, Street, [place]): [id number] - [place], [place] 3. On Med List:   4. Last Visit with PCP: 06/07/19  5. Next visit date with PCP: 07/04/19   Agent: Please be advised that RX refills may take up to 3 business days. We ask that you follow-up with your pharmacy.

## 2020-06-16 NOTE — Telephone Encounter (Signed)
Last OV 06/07/19 Last Refill 06/07/19 & 06/10/19 Next OV scheduled for 07/03/20 for physical

## 2020-07-03 ENCOUNTER — Encounter: Payer: Self-pay | Admitting: Family

## 2020-07-03 ENCOUNTER — Ambulatory Visit (INDEPENDENT_AMBULATORY_CARE_PROVIDER_SITE_OTHER): Payer: 59 | Admitting: Family

## 2020-07-03 ENCOUNTER — Encounter: Payer: Self-pay | Admitting: Gastroenterology

## 2020-07-03 ENCOUNTER — Other Ambulatory Visit: Payer: Self-pay

## 2020-07-03 VITALS — BP 128/80 | HR 77 | Temp 98.3°F | Ht 63.0 in | Wt 225.0 lb

## 2020-07-03 DIAGNOSIS — Z13220 Encounter for screening for lipoid disorders: Secondary | ICD-10-CM | POA: Diagnosis not present

## 2020-07-03 DIAGNOSIS — E559 Vitamin D deficiency, unspecified: Secondary | ICD-10-CM

## 2020-07-03 DIAGNOSIS — K5909 Other constipation: Secondary | ICD-10-CM | POA: Diagnosis not present

## 2020-07-03 DIAGNOSIS — Z0000 Encounter for general adult medical examination without abnormal findings: Secondary | ICD-10-CM | POA: Diagnosis not present

## 2020-07-03 MED ORDER — LOSARTAN POTASSIUM-HCTZ 100-25 MG PO TABS: 1.0000 | ORAL_TABLET | Freq: Every day | ORAL | 3 refills | Status: DC

## 2020-07-03 MED ORDER — POTASSIUM CHLORIDE CRYS ER 20 MEQ PO TBCR: 20.0000 meq | EXTENDED_RELEASE_TABLET | Freq: Every day | ORAL | 3 refills | Status: DC

## 2020-07-03 MED ORDER — AMLODIPINE BESYLATE 10 MG PO TABS: 10.0000 mg | ORAL_TABLET | Freq: Every day | ORAL | 3 refills | Status: DC

## 2020-07-03 NOTE — Patient Instructions (Signed)
Your antihistamines are probably contributing to the constipation; stay on the Colace for now and then discuss with GI;

## 2020-07-03 NOTE — Progress Notes (Signed)
Cynthia Carpenter is a 34 y.o. female with the following history as recorded in EpicCare:  Patient Active Problem List   Diagnosis Date Noted  . Seasonal and perennial allergic rhinoconjunctivitis 04/27/2020  . Menorrhagia 09/23/2019  . Fibroid, uterine 09/17/2019  . Moderate persistent asthma without complication 35/67/0141  . Adverse food reaction 05/30/2019  . Other atopic dermatitis 05/30/2019  . History of unprotected sex 01/08/2013  . FH: breast cancer in first degree relative 01/08/2013  . Obesity 12/27/2012  . HIDRADENITIS SUPPURATIVA 03/30/2010  . Hypertension 03/30/2010    Current Outpatient Medications  Medication Sig Dispense Refill  . albuterol (VENTOLIN HFA) 108 (90 Base) MCG/ACT inhaler Inhale 1-2 puffs into the lungs every 6 (six) hours as needed for wheezing or shortness of breath. 18 g 1  . Alcaftadine 0.25 % SOLN Apply 1 drop to eye daily as needed (itchy/watery eyes). 3 mL 5  . amLODipine (NORVASC) 10 MG tablet Take 1 tablet (10 mg total) by mouth daily. 90 tablet 3  . azelastine (ASTELIN) 0.1 % nasal spray Place 1-2 sprays into both nostrils 2 (two) times daily as needed (runny nose). Use in each nostril as directed 30 mL 5  . cetirizine (ZYRTEC) 10 MG tablet Take 10 mg by mouth daily.    . fluticasone (FLONASE) 50 MCG/ACT nasal spray Place 1 spray into both nostrils in the morning and at bedtime. For nasal congestion 16 g 5  . fluticasone furoate-vilanterol (BREO ELLIPTA) 100-25 MCG/INH AEPB INHALE 1 PUFF INTO LUNGS EVERY DAY 60 each 2  . levocetirizine (XYZAL) 5 MG tablet Take 5 mg by mouth every evening.    Marland Kitchen losartan-hydrochlorothiazide (HYZAAR) 100-25 MG tablet Take 1 tablet by mouth daily. 90 tablet 3  . montelukast (SINGULAIR) 10 MG tablet TAKE 1 TABLET BY MOUTH EVERYDAY AT BEDTIME 30 tablet 0  . potassium chloride SA (KLOR-CON) 20 MEQ tablet Take 1 tablet (20 mEq total) by mouth daily. 90 tablet 3  . sodium chloride (OCEAN) 0.65 % SOLN nasal spray Place 1  spray into both nostrils as needed for congestion. 15 mL 0  . ibuprofen (ADVIL) 600 MG tablet take 1 tablet po pc every 6 hours for 5 days then prn-post operative pain (Patient not taking: Reported on 07/03/2020) 30 tablet 1   No current facility-administered medications for this visit.    Allergies: Patient has no known allergies.  Past Medical History:  Diagnosis Date  . Asthma   . Eczema   . Hypertension   . Obesity   . Urticaria     Past Surgical History:  Procedure Laterality Date  . ABDOMINAL HYSTERECTOMY  09/23/2019  . CYSTOSCOPY  09/23/2019   Procedure: Cystoscopy;  Surgeon: Everett Graff, MD;  Location: [facility];  Service: Gynecology;;  . LAPAROSCOPIC VAGINAL HYSTERECTOMY WITH SALPINGECTOMY Bilateral 09/23/2019   Procedure: LAPAROSCOPIC ASSISTED VAGINAL HYSTERECTOMY WITH SALPINGECTOMY;  Surgeon: Everett Graff, MD;  Location: [facility];  Service: Gynecology;  Laterality: Bilateral;  . TUBAL LIGATION    . TYMPANOSTOMY TUBE PLACEMENT      Family History  Problem Relation Age of Onset  . Arthritis Mother   . Cancer Mother   . Hyperlipidemia Mother   . Hyperthyroidism Mother   . Eczema Mother   . Alcohol abuse Father   . Hypertension Father   . Diabetes Brother   . Cancer Maternal Grandmother   . Hyperlipidemia Maternal Grandmother   . Hypertension Maternal Grandmother   . Depression Paternal Grandmother     Social History  Tobacco Use  . Smoking status: Never Smoker  . Smokeless tobacco: Never Used  Substance Use Topics  . 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   HENT: Negative.   Eyes: Negative.   Respiratory: Negative.   Cardiovascular: Negative.   Gastrointestinal: Negative.   Genitourinary: Negative.   Musculoskeletal: Negative.   Skin: Negative.   Neurological: Negative.   Endo/Heme/Allergies: Negative.   Psychiatric/Behavioral: Negative.      Objective:  Vitals:   07/03/20 1543  BP: 128/80  Pulse: 77  Temp: 98.3 F (36.8 C)  TempSrc: Oral  SpO2: 99%  Weight: (!) 225 lb (102.1 kg)  Height: '5\' 3"'  (1.6 m)    General: Well developed, well nourished, in no acute distress  Skin : Warm and dry.  Head: Normocephalic and atraumatic  Eyes: Sclera and conjunctiva clear; pupils round and reactive to light; extraocular movements intact  Ears: External normal; canals clear; tympanic membranes normal  Oropharynx: Pink, supple. No suspicious lesions  Neck: Supple without thyromegaly, adenopathy  Lungs: Respirations unlabored; clear to auscultation bilaterally without wheeze, rales, rhonchi  CVS exam: normal rate and regular rhythm.  Abdomen: Soft; nontender; nondistended; normoactive bowel sounds; no masses or hepatosplenomegaly  Musculoskeletal: No deformities; no active joint inflammation  Extremities: No edema, cyanosis, clubbing  Vessels: Symmetric bilaterally  Neurologic: Alert and oriented; speech intact; face symmetrical; moves all extremities well; CNII-XII intact without focal deficit  Assessment:  1. PE (physical exam), annual   2. Chronic constipation   3. Lipid screening   4. Vitamin D deficiency     Plan:  Age appropriate preventive healthcare needs addressed; encouraged regular eye doctor and dental exams; encouraged regular exercise and weight loss goals; will update labs and refills as needed today; follow-up to be determined; Suspect chronic antihistamine usage is contributing to constipation but agree that GI consult is warranted; she will use Colace until she can see GI; she did not have  good results that she could control with other medications to treat constipation. Keep appointment to get COVID vaccine;  This visit occurred during the SARS-CoV-2 public health emergency.  Safety protocols were in place, including screening questions prior to the visit, additional usage of staff PPE, and extensive cleaning of exam room while observing appropriate contact time as indicated for disinfecting solutions.      No follow-ups on file.  Orders Placed This Encounter  Procedures  . CBC with Differential/Platelet    Standing Status:   Future    Number of Occurrences:   1    Standing Expiration Date:   07/03/2021  . Comp Met (CMET)    Standing Status:   Future    Number of Occurrences:   1    Standing Expiration Date:   07/03/2021  . Lipid panel    Standing Status:   Future    Number of Occurrences:   1    Standing Expiration Date:   07/03/2021  . TSH    Standing Status:   Future    Number of Occurrences:   1  Standing Expiration Date:   07/03/2021  . Vitamin D (25 hydroxy)    Standing Status:   Future    Number of Occurrences:   1    Standing Expiration Date:   07/03/2021  . Ambulatory referral to Gastroenterology    Referral Priority:   Routine    Referral Type:   Consultation    Referral Reason:   Specialty Services Required    Number of Visits Requested:   1    Requested Prescriptions   Signed Prescriptions Disp Refills  . amLODipine (NORVASC) 10 MG tablet 90 tablet 3    Sig: Take 1 tablet (10 mg total) by mouth daily.  Marland Kitchen losartan-hydrochlorothiazide (HYZAAR) 100-25 MG tablet 90 tablet 3    Sig: Take 1 tablet by mouth daily.  . potassium chloride SA (KLOR-CON) 20 MEQ tablet 90 tablet 3    Sig: Take 1 tablet (20 mEq total) by mouth daily.

## 2020-07-04 LAB — CBC WITH DIFFERENTIAL/PLATELET
Absolute Monocytes: 916 cells/uL (ref 200–950)
Basophils Absolute: 46 cells/uL (ref 0–200)
Basophils Relative: 0.4 %
Eosinophils Absolute: 325 cells/uL (ref 15–500)
Eosinophils Relative: 2.8 %
HCT: 36.5 % (ref 35.0–45.0)
Hemoglobin: 12 g/dL (ref 11.7–15.5)
Lymphs Abs: 2784 cells/uL (ref 850–3900)
MCH: 26.5 pg — ABNORMAL LOW (ref 27.0–33.0)
MCHC: 32.9 g/dL (ref 32.0–36.0)
MCV: 80.8 fL (ref 80.0–100.0)
MPV: 9.4 fL (ref 7.5–12.5)
Monocytes Relative: 7.9 %
Neutro Abs: 7528 cells/uL (ref 1500–7800)
Neutrophils Relative %: 64.9 %
Platelets: 372 10*3/uL (ref 140–400)
RBC: 4.52 10*6/uL (ref 3.80–5.10)
RDW: 14.3 % (ref 11.0–15.0)
Total Lymphocyte: 24 %
WBC: 11.6 10*3/uL — ABNORMAL HIGH (ref 3.8–10.8)

## 2020-07-04 LAB — LIPID PANEL
Cholesterol: 142 mg/dL (ref <200)
HDL: 48 mg/dL — ABNORMAL LOW (ref >=50)
LDL Cholesterol (Calc): 69 mg/dL (calc)
Non-HDL Cholesterol (Calc): 94 mg/dL (calc) (ref <130)
Total CHOL/HDL Ratio: 3 (calc) (ref <5.0)
Triglycerides: 167 mg/dL — ABNORMAL HIGH (ref <150)

## 2020-07-04 LAB — COMPREHENSIVE METABOLIC PANEL
AG Ratio: 1.2 (calc) (ref 1.0–2.5)
ALT: 15 U/L (ref 6–29)
AST: 16 U/L (ref 10–30)
Albumin: 4.2 g/dL (ref 3.6–5.1)
Alkaline phosphatase (APISO): 75 U/L (ref 31–125)
BUN: 8 mg/dL (ref 7–25)
CO2: 32 mmol/L (ref 20–32)
Calcium: 9.5 mg/dL (ref 8.6–10.2)
Chloride: 101 mmol/L (ref 98–110)
Creat: 0.68 mg/dL (ref 0.50–1.10)
Globulin: 3.6 g/dL (calc) (ref 1.9–3.7)
Glucose, Bld: 79 mg/dL (ref 65–99)
Potassium: 3.3 mmol/L — ABNORMAL LOW (ref 3.5–5.3)
Sodium: 139 mmol/L (ref 135–146)
Total Bilirubin: 0.5 mg/dL (ref 0.2–1.2)
Total Protein: 7.8 g/dL (ref 6.1–8.1)

## 2020-07-04 LAB — VITAMIN D 25 HYDROXY (VIT D DEFICIENCY, FRACTURES): Vit D, 25-Hydroxy: 20 ng/mL — ABNORMAL LOW (ref 30–100)

## 2020-07-04 LAB — TSH: TSH: 1.9 mIU/L

## 2020-07-07 ENCOUNTER — Other Ambulatory Visit: Payer: Self-pay | Admitting: Family

## 2020-07-07 DIAGNOSIS — R899 Unspecified abnormal finding in specimens from other organs, systems and tissues: Secondary | ICD-10-CM

## 2020-07-07 MED ORDER — VITAMIN D (ERGOCALCIFEROL) 1.25 MG (50000 UNIT) PO CAPS
50000.0000 [IU] | ORAL_CAPSULE | ORAL | 0 refills | Status: AC
Start: 2020-07-07 — End: 2020-09-23

## 2020-07-09 ENCOUNTER — Other Ambulatory Visit: Payer: Self-pay | Admitting: Allergy

## 2020-08-04 ENCOUNTER — Other Ambulatory Visit: Payer: Self-pay | Admitting: Allergy

## 2020-09-03 ENCOUNTER — Ambulatory Visit: Payer: 59 | Admitting: Gastroenterology

## 2020-09-03 ENCOUNTER — Encounter: Payer: Self-pay | Admitting: Gastroenterology

## 2020-09-03 VITALS — BP 120/70 | HR 92 | Ht 62.75 in | Wt 223.5 lb

## 2020-09-03 DIAGNOSIS — K5909 Other constipation: Secondary | ICD-10-CM

## 2020-09-03 DIAGNOSIS — R1030 Lower abdominal pain, unspecified: Secondary | ICD-10-CM | POA: Diagnosis not present

## 2020-09-03 MED ORDER — PLENVU 140 G PO SOLR
140.0000 g | ORAL | 0 refills | Status: DC
Start: 2020-09-03 — End: 2020-09-17

## 2020-09-03 NOTE — Patient Instructions (Addendum)
If you are age 34 or older, your body mass index should be between 23-30. Your Body mass index is 39.91 kg/m. If this is out of the aforementioned range listed, please consider follow up with your Primary Care Provider.  If you are age 41 or younger, your body mass index should be between 19-25. Your Body mass index is 39.91 kg/m. If this is out of the aformentioned range listed, please consider follow up with your Primary Care Provider.   You have been scheduled for a colonoscopy. Please follow written instructions given to you at your visit today.  Please pick up your prep supplies at the pharmacy within the next 1-3 days. If you use inhalers (even only as needed), please bring them with you on the day of your procedure.  2 days before the colonoscopy take Miralax twice a day.  Medication Samples have been provided to the patient.  Drug name: Linzess        Strength: 27mcg/145mcg        Qty: 2/2  LOT: [id number]  Exp.Date: 04-2022/ 10-2020  The patient has been instructed regarding the correct time, dose, and frequency of taking this medication, including desired effects and most common side effects.   It was a pleasure to see you today!  Dr. Loletha Carrow

## 2020-09-03 NOTE — Progress Notes (Signed)
[facility] Consult Note:  History: Cynthia Carpenter 09/03/2020  Referring provider: Marrian Salvage, [place]  Reason for consult/chief complaint: Constipation, Diarrhea, and Abdominal Pain (intermittent lower abd pain/pressure)   Subjective  HPI:  This is a very pleasant 34 year old patient referred by primary care for chronic abdominal pain and constipation.  She reports many years of every other day being her typical bowel pattern, but it changed a few years ago.  She then developed more severe and frequent constipation and would also get intermittent diarrhea.  She was having chronic pelvic pain and fibroids and ultimately had a vaginal hysterectomy in 2020.  This significantly improved her pelvic pain, but she still has it to some degree as a pressure sensation when she is struggling with bowel movements.  MiraLAX was tried but stool became too loose.  Primary care recommended daily Dulcolax which has helped to some degree, but the patient and her PCP have had concerns about its long-term use. [name] has 2 children ages 48 and 71, both by vaginal delivery.  She does not recall an episiotomy or significant tear with childbirth.  She has not had to engage in measures such as manual disimpaction or pressure in the vagina to help with bowel movements.  She has sometimes put pressure on the perineum but was not sure it was helpful.  She has not noticed any rectal or vaginal prolapse.  She will get the urge for a bowel movement every couple of days and then sit for long periods, and perhaps passed nothing or just some pebbles of stool after straining.   ROS:  Review of Systems  Constitutional: Negative for appetite change and unexpected weight change.  HENT: Negative for mouth sores and voice change.   Eyes: Negative for pain and redness.  Respiratory: Negative for cough and shortness of breath.   Cardiovascular: Negative for chest pain and palpitations.   Genitourinary: Negative for dysuria and hematuria.  Musculoskeletal: Negative for arthralgias and myalgias.  Skin: Negative for pallor and rash.  Neurological: Negative for weakness and headaches.  Hematological: Negative for adenopathy.     Past Medical History: Past Medical History:  Diagnosis Date  . Asthma   . Eczema   . Hypertension   . Obesity   . Urticaria      Past Surgical History: Past Surgical History:  Procedure Laterality Date  . CYSTOSCOPY  09/23/2019   Procedure: Cystoscopy;  Surgeon: Everett Graff, MD;  Location: [facility];  Service: Gynecology;;  . LAPAROSCOPIC VAGINAL HYSTERECTOMY WITH SALPINGECTOMY Bilateral 09/23/2019   Procedure: LAPAROSCOPIC ASSISTED VAGINAL HYSTERECTOMY WITH SALPINGECTOMY;  Surgeon: Everett Graff, MD;  Location: [facility];  Service: Gynecology;  Laterality: Bilateral;  . TUBAL LIGATION    . TYMPANOSTOMY TUBE PLACEMENT       Family History: Family History  Problem Relation Age of Onset  . Arthritis Mother   . Hyperlipidemia Mother   . Hyperthyroidism Mother   . Eczema Mother   . Breast cancer Mother   . Alcohol abuse Father   . Hypertension Father   . Drug abuse Father   . Stroke Father   . Diabetes Brother   . Hyperlipidemia Maternal Grandmother   . Hypertension Maternal Grandmother   . Ovarian cancer Maternal Grandmother   . Depression Paternal Grandmother   . Heart disease Paternal Grandmother   . Stroke Paternal Grandmother   . Diabetes Maternal Grandfather   . Diabetes Maternal Uncle   . Diabetes Maternal Uncle   . Stroke  Maternal Aunt   . Eczema Son     Social History: Social History   Socioeconomic History  . Marital status: Married    Spouse name: Not on file  . Number of children: 2  . Years of education: Not on file  . Highest education level: Not on file  Occupational History  . Occupation: Social worker/therapist  Tobacco Use  . Smoking status: Never Smoker  . Smokeless tobacco: Never Used  Vaping  Use  . Vaping Use: Never used  Substance and Sexual Activity  . Alcohol use: Yes    Comment: socially  . Drug use: No  . Sexual activity: Yes    Birth control/protection: Surgical    Comment: tubal ligation11/04/2014  Other Topics Concern  . Not on file  Social History Narrative  . Not on file   Social Determinants of Health   Financial Resource Strain:   . Difficulty of Paying Living Expenses: Not on file  Food Insecurity:   . Worried About Charity fundraiser in the Last Year: Not on file  . Ran Out of Food in the Last Year: Not on file  Transportation Needs:   . Lack of Transportation (Medical): Not on file  . Lack of Transportation (Non-Medical): Not on file  Physical Activity:   . Days of Exercise per Week: Not on file  . Minutes of Exercise per Session: Not on file  Stress:   . Feeling of Stress : Not on file  Social Connections:   . Frequency of Communication with Friends and Family: Not on file  . Frequency of Social Gatherings with Friends and Family: Not on file  . Attends Religious Services: Not on file  . Active Member of Clubs or Organizations: Not on file  . Attends Archivist Meetings: Not on file  . Marital Status: Not on file    Allergies: No Known Allergies  Outpatient Meds: Current Outpatient Medications  Medication Sig Dispense Refill  . albuterol (VENTOLIN HFA) 108 (90 Base) MCG/ACT inhaler Inhale 1-2 puffs into the lungs every 6 (six) hours as needed for wheezing or shortness of breath. 18 g 1  . Alcaftadine 0.25 % SOLN Apply 1 drop to eye daily as needed (itchy/watery eyes). 3 mL 5  . amLODipine (NORVASC) 10 MG tablet Take 1 tablet (10 mg total) by mouth daily. 90 tablet 3  . azelastine (ASTELIN) 0.1 % nasal spray Place 1-2 sprays into both nostrils 2 (two) times daily as needed (runny nose). Use in each nostril as directed 30 mL 5  . cetirizine (ZYRTEC) 10 MG tablet Take 10 mg by mouth daily.    . fluticasone (FLONASE) 50 MCG/ACT  nasal spray Place 1 spray into both nostrils in the morning and at bedtime. For nasal congestion 16 g 5  . fluticasone furoate-vilanterol (BREO ELLIPTA) 100-25 MCG/INH AEPB INHALE 1 PUFF INTO LUNGS EVERY DAY 60 each 2  . ibuprofen (ADVIL) 600 MG tablet take 1 tablet po pc every 6 hours for 5 days then prn-post operative pain 30 tablet 1  . levocetirizine (XYZAL) 5 MG tablet Take 5 mg by mouth every evening.    Marland Kitchen losartan-hydrochlorothiazide (HYZAAR) 100-25 MG tablet Take 1 tablet by mouth daily. 90 tablet 3  . montelukast (SINGULAIR) 10 MG tablet TAKE 1 TABLET BY MOUTH EVERYDAY AT BEDTIME 30 tablet 0  . potassium chloride SA (KLOR-CON) 20 MEQ tablet Take 1 tablet (20 mEq total) by mouth daily. 90 tablet 3  . sodium chloride (OCEAN) 0.65 %  SOLN nasal spray Place 1 spray into both nostrils as needed for congestion. 15 mL 0  . Vitamin D, Ergocalciferol, (DRISDOL) 1.25 MG (50000 UNIT) CAPS capsule Take 1 capsule (50,000 Units total) by mouth every 7 (seven) days for 12 doses. 12 capsule 0   No current facility-administered medications for this visit.      ___________________________________________________________________ Objective   Exam:  BP 120/70 (BP Location: Left Arm, Patient Position: Sitting, Cuff Size: Normal)   Pulse 92   Ht 5' 2.75" (1.594 m) Comment: height measured without shoes  Wt 223 lb 8 oz (101.4 kg)   LMP 10/04/2019   BMI 39.91 kg/m    General: Well-appearing  Eyes: sclera anicteric, no redness  ENT: oral mucosa moist without lesions, no cervical or supraclavicular lymphadenopathy  CV: RRR without murmur, S1/S2, no JVD, no peripheral edema  Resp: clear to auscultation bilaterally, normal RR and effort noted  GI: soft, no tenderness, with active bowel sounds. No guarding or palpable organomegaly noted.  Skin; warm and dry, no rash or jaundice noted  Neuro: awake, alert and oriented x 3. Normal gross motor function and fluent speech Rectal exam not performed  (awake functional rectal exam to be done day of colonoscopy) Labs:  CBC Latest Ref Rng & Units 07/03/2020 09/24/2019 09/17/2019  WBC 3.8 - 10.8 Thousand/uL 11.6(H) 13.2(H) 8.9  Hemoglobin 11.7 - 15.5 g/dL 12.0 10.5(L) 12.1  Hematocrit 35 - 45 % 36.5 31.4(L) 36.5  Platelets 140 - 400 Thousand/uL 372 366 413(H)   CMP Latest Ref Rng & Units 07/03/2020 09/24/2019 09/17/2019  Glucose 65 - 99 mg/dL 79 145(H) 107(H)  BUN 7 - 25 mg/dL 8 <5(L) 10  Creatinine 0.50 - 1.10 mg/dL 0.68 0.73 0.94  Sodium 135 - 146 mmol/L 139 135 136  Potassium 3.5 - 5.3 mmol/L 3.3(L) 3.2(L) 3.1(L)  Chloride 98 - 110 mmol/L 101 96(L) 98  CO2 20 - 32 mmol/L 32 26 30  Calcium 8.6 - 10.2 mg/dL 9.5 8.9 9.6  Total Protein 6.1 - 8.1 g/dL 7.8 - -  Total Bilirubin 0.2 - 1.2 mg/dL 0.5 - -  Alkaline Phos 39 - 117 U/L - - -  AST 10 - 30 U/L 16 - -  ALT 6 - 29 U/L 15 - -   Lab Results  Component Value Date   TSH 1.90 07/03/2020     Radiologic Studies:  None  Assessment: Encounter Diagnoses  Name Primary?  . Chronic constipation Yes  . Lower abdominal pain     Years of symptoms that have slowly worsened.  She finally felt ready to attend to it after her pelvic surgery last year and slow worsening of symptoms. This does not appear to be side effect of her medicines.  Suspect functional constipation or pelvic floor dysfunction, less likely rectocele.  Plan:  Trial of Linzess 72 mcg daily, increasing to 145 mcg daily if needed (samples given Colonoscopy  The benefits and risks of the planned procedure were described in detail with the patient or (when appropriate) their health care proxy.  Risks were outlined as including, but not limited to, bleeding, infection, perforation, adverse medication reaction leading to cardiac or pulmonary decompensation, pancreatitis (if ERCP).  The limitation of incomplete mucosal visualization was also discussed.  No guarantees or warranties were given.  If rectal exam suggests PFD and  colonoscopy unrevealing, anorectal manometry.  If that study is unrevealing and Linzess not helpful or limited by side effects, trial of prucalopride or tegaserod if insurance approval.  Thank you for the courtesy of this consult.  Please call me with any questions or concerns.  Nelida Meuse III  CC: Referring provider noted above

## 2020-09-17 ENCOUNTER — Other Ambulatory Visit: Payer: Self-pay

## 2020-09-17 ENCOUNTER — Ambulatory Visit (AMBULATORY_SURGERY_CENTER): Payer: 59 | Admitting: Gastroenterology

## 2020-09-17 ENCOUNTER — Encounter: Payer: Self-pay | Admitting: Gastroenterology

## 2020-09-17 VITALS — BP 105/59 | HR 68 | Temp 97.8°F | Resp 16 | Ht 62.0 in | Wt 223.0 lb

## 2020-09-17 DIAGNOSIS — K5909 Other constipation: Secondary | ICD-10-CM | POA: Diagnosis present

## 2020-09-17 MED ORDER — LINACLOTIDE 72 MCG PO CAPS: 72.0000 ug | ORAL_CAPSULE | Freq: Every day | ORAL | 3 refills | Status: DC

## 2020-09-17 MED ORDER — SODIUM CHLORIDE 0.9 % IV SOLN: 500.0000 mL | INTRAVENOUS | Status: DC

## 2020-09-17 NOTE — Progress Notes (Deleted)
PT taken to PACU. Monitors in place. VSS. Report given to RN. 

## 2020-09-17 NOTE — Patient Instructions (Signed)
YOU HAD AN ENDOSCOPIC PROCEDURE TODAY AT THE [facility]:   Refer to the procedure report that was given to you for any specific questions about what was found during the examination.  If the procedure report does not answer your questions, please call your gastroenterologist to clarify.  If you requested that your care partner not be given the details of your procedure findings, then the procedure report has been included in a sealed envelope for you to review at your convenience later.  YOU SHOULD EXPECT: Some feelings of bloating in the abdomen. Passage of more gas than usual.  Walking can help get rid of the air that was put into your GI tract during the procedure and reduce the bloating. If you had a lower endoscopy (such as a colonoscopy or flexible sigmoidoscopy) you may notice spotting of blood in your stool or on the toilet paper. If you underwent a bowel prep for your procedure, you may not have a normal bowel movement for a few days.  Please Note:  You might notice some irritation and congestion in your nose or some drainage.  This is from the oxygen used during your procedure.  There is no need for concern and it should clear up in a day or so.  SYMPTOMS TO REPORT IMMEDIATELY:   Following lower endoscopy (colonoscopy or flexible sigmoidoscopy):  Excessive amounts of blood in the stool  Significant tenderness or worsening of abdominal pains  Swelling of the abdomen that is new, acute  Fever of 100F or higher  For urgent or emergent issues, a gastroenterologist can be reached at any hour by calling [phone]. Do not use MyChart messaging for urgent concerns.    DIET:  We do recommend a small meal at first, but then you may proceed to your regular diet.  Drink plenty of fluids but you should avoid alcoholic beverages for 24 hours.  ACTIVITY:  You should plan to take it easy for the rest of today and you should NOT DRIVE or use heavy machinery until tomorrow (because  of the sedation medicines used during the test).    FOLLOW UP: Our staff will call the number listed on your records 48-72 hours following your procedure to check on you and address any questions or concerns that you may have regarding the information given to you following your procedure. If we do not reach you, we will leave a message.  We will attempt to reach you two times.  During this call, we will ask if you have developed any symptoms of COVID 19. If you develop any symptoms (ie: fever, flu-like symptoms, shortness of breath, cough etc.) before then, please call [phone].  If you test positive for Covid 19 in the 2 weeks post procedure, please call and report this information to us.    If any biopsies were taken you will be contacted by phone or by letter within the next 1-3 weeks.  Please call us at [phone] if you have not heard about the biopsies in 3 weeks.    SIGNATURES/CONFIDENTIALITY: You and/or your care partner have signed paperwork which will be entered into your electronic medical record.  These signatures attest to the fact that that the information above on your After Visit Summary has been reviewed and is understood.  Full responsibility of the confidentiality of this discharge information lies with you and/or your care-partner. 

## 2020-09-17 NOTE — Op Note (Signed)
[facility] Patient Name: Cynthia Carpenter Procedure Date: 09/17/2020 11:23 AM MRN: [id number] Endoscopist: Mallie Mussel L. Loletha Carrow , MD Age: 34 Referring MD:  Date of Birth: 02/23/1986 Gender: Female Account #: [id number] Procedure:                Colonoscopy Indications:              Constipation (recently improved on Linzess 72                            micrograms daily) Medicines:                Monitored Anesthesia Care Procedure:                Pre-Anesthesia Assessment:                           - Prior to the procedure, a History and Physical                            was performed, and patient medications and                            allergies were reviewed. The patient's tolerance of                            previous anesthesia was also reviewed. The risks                            and benefits of the procedure and the sedation                            options and risks were discussed with the patient.                            All questions were answered, and informed consent                            was obtained. Prior Anticoagulants: The patient has                            taken no previous anticoagulant or antiplatelet                            agents. ASA Grade Assessment: III - A patient with                            severe systemic disease. After reviewing the risks                            and benefits, the patient was deemed in                            satisfactory condition to undergo the procedure.  After obtaining informed consent, the colonoscope                            was passed under direct vision. Throughout the                            procedure, the patient's blood pressure, pulse, and                            oxygen saturations were monitored continuously. The                            Colonoscope was introduced through the anus and                            advanced to the the terminal ileum, with                             identification of the appendiceal orifice and IC                            valve. The colonoscopy was performed without                            difficulty. The patient tolerated the procedure                            well. The quality of the bowel preparation was                            excellent. The terminal ileum, ileocecal valve,                            appendiceal orifice, and rectum were photographed. Scope In: 11:35:11 AM Scope Out: 11:44:58 AM Scope Withdrawal Time: 0 hours 6 minutes 56 seconds  Total Procedure Duration: 0 hours 9 minutes 47 seconds  Findings:                 Awake, pre-procedure perianal and digital rectal                            examinations were normal. Normal resting and                            voluntary ST, normal abdominal wall contraction and                            rectal descent with simulated defectation.                           The terminal ileum appeared normal.                           The entire examined colon appeared normal on direct  and retroflexion views. Complications:            No immediate complications. Estimated Blood Loss:     Estimated blood loss: none. Impression:               - The examined portion of the ileum was normal.                           - The entire examined colon is normal on direct and                            retroflexion views.                           - No specimens collected. Recommendation:           - Patient has a contact number available for                            emergencies. The signs and symptoms of potential                            delayed complications were discussed with the                            patient. Return to normal activities tomorrow.                            Written discharge instructions were provided to the                            patient.                           - Resume previous diet.                            - Continue present medications. Rx: Linzess 72                            micrograms once daily. Disp#30, RF 3                           - No recommendation at this time regarding repeat                            colonoscopy due to young age. Kandon Hosking L. Loletha Carrow, MD 09/17/2020 11:56:08 AM This report has been signed electronically.

## 2020-09-17 NOTE — Progress Notes (Signed)
PT taken to PACU. Monitors in place. VSS. Report given to RN. 

## 2020-09-21 ENCOUNTER — Telehealth: Payer: Self-pay

## 2020-09-21 NOTE — Telephone Encounter (Signed)
  Follow up Call-  Call back number 09/17/2020  Post procedure Call Back phone  # [phone]  Permission to leave phone message Yes  Some recent data might be hidden     Patient questions:  Do you have a fever, pain , or abdominal swelling? No. Pain Score  0 *  Have you tolerated food without any problems? Yes.    Have you been able to return to your normal activities? Yes.    Do you have any questions about your discharge instructions: Diet   No. Medications  No. Follow up visit  No.  Do you have questions or concerns about your Care? No.  Actions: * If pain score is 4 or above: No action needed, pain <4.  1. Have you developed a fever since your procedure? no  2.   Have you had an respiratory symptoms (SOB or cough) since your procedure? no  3.   Have you tested positive for COVID 19 since your procedure no  4.   Have you had any family members/close contacts diagnosed with the COVID 19 since your procedure?  no   If yes to any of these questions please route to Joylene John, RN and Joella Prince, RN

## 2020-09-27 ENCOUNTER — Other Ambulatory Visit: Payer: Self-pay | Admitting: Allergy

## 2020-12-23 ENCOUNTER — Telehealth (INDEPENDENT_AMBULATORY_CARE_PROVIDER_SITE_OTHER): Payer: 59 | Admitting: Family Medicine

## 2020-12-23 ENCOUNTER — Other Ambulatory Visit: Payer: Self-pay

## 2020-12-23 ENCOUNTER — Encounter: Payer: Self-pay | Admitting: Family Medicine

## 2020-12-23 VITALS — Temp 98.9°F

## 2020-12-23 DIAGNOSIS — J029 Acute pharyngitis, unspecified: Secondary | ICD-10-CM

## 2020-12-23 DIAGNOSIS — J4531 Mild persistent asthma with (acute) exacerbation: Secondary | ICD-10-CM

## 2020-12-23 MED ORDER — PREDNISONE 20 MG PO TABS: 40.0000 mg | ORAL_TABLET | Freq: Every day | ORAL | 0 refills | Status: AC

## 2020-12-23 NOTE — Progress Notes (Signed)
Chief Complaint  Patient presents with  . Sore Throat  . Nasal Congestion  . Headache    Cynthia Carpenter here for URI complaints. Due to COVID-19 pandemic, we are interacting via web portal for an electronic face-to-face visit. I verified patient's ID using 2 identifiers. Patient agreed to proceed with visit via this method. Patient is at home, I am at office. Patient and I are present for visit.   Duration: 3 days  Associated symptoms: rhinorrhea, itchy watery eyes, sore throat, wheezing, shortness of breath, myalgia and cough Denies: sinus congestion, sinus pain, ear pain, ear drainage and N/V/D Treatment to date: Allergy medication, Alka Seltzer cold and sinus Sick contacts: Not directly, classmate of her child had a positive test.   Past Medical History:  Diagnosis Date  . Asthma   . Eczema   . Hypertension   . Obesity   . Urticaria    Exam Temp 98.9 F (37.2 C) (Oral)   LMP 10/04/2019  No conversational dyspnea Age appropriate judgment and insight Nml affect and mood  Sore throat - Plan: predniSONE (DELTASONE) 20 MG tablet  Mild persistent asthma with exacerbation - Plan: predniSONE (DELTASONE) 20 MG tablet  5 d pred burst 40 mg/d. If still having ST in 2 d, will send message and I will send in amox. She has a covid test pending.  Continue to push fluids, practice good hand hygiene, cover mouth when coughing. F/u prn. If starting to experience fevers, shaking, or shortness of breath, seek immediate care. Pt voiced understanding and agreement to the plan.  [place], DO 12/23/20 3:32 PM

## 2020-12-25 ENCOUNTER — Other Ambulatory Visit: Payer: Self-pay | Admitting: Family Medicine

## 2020-12-25 MED ORDER — AMOXICILLIN 875 MG PO TABS: 875.0000 mg | ORAL_TABLET | Freq: Two times a day (BID) | ORAL | 0 refills | Status: AC

## 2021-02-21 ENCOUNTER — Other Ambulatory Visit: Payer: Self-pay | Admitting: Family

## 2021-05-28 ENCOUNTER — Other Ambulatory Visit: Payer: Self-pay

## 2021-05-28 ENCOUNTER — Encounter: Payer: Self-pay | Admitting: Family

## 2021-05-28 ENCOUNTER — Ambulatory Visit: Payer: 59 | Admitting: Family

## 2021-05-28 VITALS — BP 124/70 | HR 84 | Temp 98.9°F | Ht 63.0 in | Wt 231.2 lb

## 2021-05-28 DIAGNOSIS — Z13220 Encounter for screening for lipoid disorders: Secondary | ICD-10-CM

## 2021-05-28 DIAGNOSIS — I10 Essential (primary) hypertension: Secondary | ICD-10-CM | POA: Diagnosis not present

## 2021-05-28 DIAGNOSIS — K5909 Other constipation: Secondary | ICD-10-CM

## 2021-05-28 DIAGNOSIS — Z114 Encounter for screening for human immunodeficiency virus [HIV]: Secondary | ICD-10-CM

## 2021-05-28 MED ORDER — POTASSIUM CHLORIDE CRYS ER 20 MEQ PO TBCR: 20.0000 meq | EXTENDED_RELEASE_TABLET | Freq: Every day | ORAL | 3 refills | Status: DC

## 2021-05-28 MED ORDER — AMLODIPINE BESYLATE 10 MG PO TABS: 1.0000 | ORAL_TABLET | Freq: Every day | ORAL | 3 refills | Status: DC

## 2021-05-28 MED ORDER — LOSARTAN POTASSIUM-HCTZ 100-25 MG PO TABS: 1.0000 | ORAL_TABLET | Freq: Every day | ORAL | 3 refills | Status: DC

## 2021-05-28 MED ORDER — LINACLOTIDE 72 MCG PO CAPS: 72.0000 ug | ORAL_CAPSULE | Freq: Every day | ORAL | 3 refills | Status: DC

## 2021-05-28 NOTE — Progress Notes (Signed)
Cynthia Carpenter is a 35 y.o. female with the following history as recorded in EpicCare:  Patient Active Problem List   Diagnosis Date Noted   Seasonal and perennial allergic rhinoconjunctivitis 04/27/2020   Menorrhagia 09/23/2019   Fibroid, uterine 09/17/2019   Moderate persistent asthma without complication 62/69/4854   Adverse food reaction 05/30/2019   Other atopic dermatitis 05/30/2019   History of unprotected sex 01/08/2013   FH: breast cancer in first degree relative 01/08/2013   Obesity 12/27/2012   HIDRADENITIS SUPPURATIVA 03/30/2010   Hypertension 03/30/2010    Current Outpatient Medications  Medication Sig Dispense Refill   albuterol (VENTOLIN HFA) 108 (90 Base) MCG/ACT inhaler Inhale 1-2 puffs into the lungs every 6 (six) hours as needed for wheezing or shortness of breath. 18 g 1   Alcaftadine 0.25 % SOLN Apply 1 drop to eye daily as needed (itchy/watery eyes). 3 mL 5   azelastine (ASTELIN) 0.1 % nasal spray Place 1-2 sprays into both nostrils 2 (two) times daily as needed (runny nose). Use in each nostril as directed 30 mL 5   fluticasone (FLONASE) 50 MCG/ACT nasal spray Place 1 spray into both nostrils in the morning and at bedtime. For nasal congestion 16 g 5   fluticasone furoate-vilanterol (BREO ELLIPTA) 100-25 MCG/INH AEPB INHALE 1 PUFF INTO LUNGS EVERY DAY 60 each 2   levocetirizine (XYZAL) 5 MG tablet Take 5 mg by mouth every evening.     montelukast (SINGULAIR) 10 MG tablet TAKE 1 TABLET BY MOUTH EVERYDAY AT BEDTIME 30 tablet 0   sodium chloride (OCEAN) 0.65 % SOLN nasal spray Place 1 spray into both nostrils as needed for congestion. 15 mL 0   amLODipine (NORVASC) 10 MG tablet Take 1 tablet (10 mg total) by mouth daily. 90 tablet 3   linaclotide (LINZESS) 72 MCG capsule Take 1 capsule (72 mcg total) by mouth daily before breakfast. 30 capsule 3   losartan-hydrochlorothiazide (HYZAAR) 100-25 MG tablet Take 1 tablet by mouth daily. 90 tablet 3   potassium  chloride SA (KLOR-CON M20) 20 MEQ tablet Take 1 tablet (20 mEq total) by mouth daily. 90 tablet 3   No current facility-administered medications for this visit.    Allergies: Patient has no known allergies.  Past Medical History:  Diagnosis Date   Asthma    Eczema    Hypertension    Obesity    Urticaria     Past Surgical History:  Procedure Laterality Date   CYSTOSCOPY  09/23/2019   Procedure: Cystoscopy;  Surgeon: Everett Graff, MD;  Location: [facility];  Service: Gynecology;;   LAPAROSCOPIC VAGINAL HYSTERECTOMY WITH SALPINGECTOMY Bilateral 09/23/2019   Procedure: LAPAROSCOPIC ASSISTED VAGINAL HYSTERECTOMY WITH SALPINGECTOMY;  Surgeon: Everett Graff, MD;  Location: [facility];  Service: Gynecology;  Laterality: Bilateral;   TUBAL LIGATION     TYMPANOSTOMY TUBE PLACEMENT      Family History  Problem Relation Age of Onset   Arthritis Mother    Hyperlipidemia Mother    Hyperthyroidism Mother    Eczema Mother    Breast cancer Mother    Alcohol abuse Father    Hypertension Father    Drug abuse Father    Stroke Father    Diabetes Brother    Hyperlipidemia Maternal Grandmother    Hypertension Maternal Grandmother    Ovarian cancer Maternal Grandmother    Depression Paternal Grandmother    Heart disease Paternal Grandmother    Stroke Paternal Grandmother    Diabetes Maternal Grandfather    Diabetes Maternal  Uncle    Diabetes Maternal Uncle    Stroke Maternal Aunt    Eczema Son    Colon cancer Neg Hx    Esophageal cancer Neg Hx    Rectal cancer Neg Hx    Stomach cancer Neg Hx     Social History   Tobacco Use   Smoking status: Never   Smokeless tobacco: Never  Substance Use Topics   Alcohol use: Yes    Comment: socially    Subjective:   6 month follow up on hypertension; no acute concerns today; would like to get labs updated as well;  Denies any chest pain, shortness of breath, blurred vision or headache.   Objective:  Vitals:   05/28/21 1548  BP: 124/70   Pulse: 84  Temp: 98.9 F (37.2 C)  TempSrc: Oral  SpO2: 98%  Weight: 231 lb 3.2 oz (104.9 kg)  Height: '5\' 3"'  (1.6 m)    General: Well developed, well nourished, in no acute distress  Skin : Warm and dry.  Head: Normocephalic and atraumatic  Eyes: Sclera and conjunctiva clear; pupils round and reactive to light; extraocular movements intact  Ears: External normal; canals clear; tympanic membranes normal  Oropharynx: Pink, supple. No suspicious lesions  Neck: Supple without thyromegaly, adenopathy  Lungs: Respirations unlabored; clear to auscultation bilaterally without wheeze, rales, rhonchi  CVS exam: normal rate and regular rhythm.  Neurologic: Alert and oriented; speech intact; face symmetrical; moves all extremities well; CNII-XII intact without focal deficit   Assessment:  1. Primary hypertension   2. Chronic constipation   3. Lipid screening   4. Encounter for screening for HIV     Plan:  Stable; refills updated; Good response to Linzess;  Check lipid panel; Check HIV per patient request;   This visit occurred during the SARS-CoV-2 public health emergency.  Safety protocols were in place, including screening questions prior to the visit, additional usage of staff PPE, and extensive cleaning of exam room while observing appropriate contact time as indicated for disinfecting solutions.    No follow-ups on file.  Orders Placed This Encounter  Procedures   CBC with Differential/Platelet   Comp Met (CMET)   Lipid panel   HIV Antibody (routine testing w rflx)    Requested Prescriptions   Signed Prescriptions Disp Refills   amLODipine (NORVASC) 10 MG tablet 90 tablet 3    Sig: Take 1 tablet (10 mg total) by mouth daily.   potassium chloride SA (KLOR-CON M20) 20 MEQ tablet 90 tablet 3    Sig: Take 1 tablet (20 mEq total) by mouth daily.   losartan-hydrochlorothiazide (HYZAAR) 100-25 MG tablet 90 tablet 3    Sig: Take 1 tablet by mouth daily.   linaclotide (LINZESS)  72 MCG capsule 30 capsule 3    Sig: Take 1 capsule (72 mcg total) by mouth daily before breakfast.

## 2021-05-31 LAB — COMPREHENSIVE METABOLIC PANEL
AG Ratio: 1.3 (calc) (ref 1.0–2.5)
ALT: 13 U/L (ref 6–29)
AST: 15 U/L (ref 10–30)
Albumin: 4.3 g/dL (ref 3.6–5.1)
Alkaline phosphatase (APISO): 78 U/L (ref 31–125)
BUN: 9 mg/dL (ref 7–25)
CO2: 24 mmol/L (ref 20–32)
Calcium: 9.8 mg/dL (ref 8.6–10.2)
Chloride: 100 mmol/L (ref 98–110)
Creat: 0.54 mg/dL (ref 0.50–1.10)
Globulin: 3.4 g/dL (calc) (ref 1.9–3.7)
Glucose, Bld: 76 mg/dL (ref 65–99)
Potassium: 3.1 mmol/L — ABNORMAL LOW (ref 3.5–5.3)
Sodium: 137 mmol/L (ref 135–146)
Total Bilirubin: 0.5 mg/dL (ref 0.2–1.2)
Total Protein: 7.7 g/dL (ref 6.1–8.1)

## 2021-05-31 LAB — CBC WITH DIFFERENTIAL/PLATELET
Absolute Monocytes: 566 cells/uL (ref 200–950)
Basophils Absolute: 19 cells/uL (ref 0–200)
Basophils Relative: 0.2 %
Eosinophils Absolute: 288 cells/uL (ref 15–500)
Eosinophils Relative: 3 %
HCT: 36.3 % (ref 35.0–45.0)
Hemoglobin: 11.8 g/dL (ref 11.7–15.5)
Lymphs Abs: 2726 cells/uL (ref 850–3900)
MCH: 26.1 pg — ABNORMAL LOW (ref 27.0–33.0)
MCHC: 32.5 g/dL (ref 32.0–36.0)
MCV: 80.3 fL (ref 80.0–100.0)
MPV: 9.6 fL (ref 7.5–12.5)
Monocytes Relative: 5.9 %
Neutro Abs: 6000 cells/uL (ref 1500–7800)
Neutrophils Relative %: 62.5 %
Platelets: 428 10*3/uL — ABNORMAL HIGH (ref 140–400)
RBC: 4.52 10*6/uL (ref 3.80–5.10)
RDW: 13.9 % (ref 11.0–15.0)
Total Lymphocyte: 28.4 %
WBC: 9.6 10*3/uL (ref 3.8–10.8)

## 2021-05-31 LAB — HIV ANTIBODY (ROUTINE TESTING W REFLEX): HIV 1&2 Ab, 4th Generation: NONREACTIVE

## 2021-05-31 LAB — LIPID PANEL
Cholesterol: 141 mg/dL (ref <200)
HDL: 45 mg/dL — ABNORMAL LOW (ref >=50)
LDL Cholesterol (Calc): 79 mg/dL (calc)
Non-HDL Cholesterol (Calc): 96 mg/dL (calc) (ref <130)
Total CHOL/HDL Ratio: 3.1 (calc) (ref <5.0)
Triglycerides: 86 mg/dL (ref <150)

## 2021-06-03 ENCOUNTER — Other Ambulatory Visit: Payer: Self-pay | Admitting: Family

## 2021-06-03 DIAGNOSIS — E559 Vitamin D deficiency, unspecified: Secondary | ICD-10-CM

## 2021-06-03 DIAGNOSIS — E876 Hypokalemia: Secondary | ICD-10-CM

## 2021-06-10 ENCOUNTER — Other Ambulatory Visit (INDEPENDENT_AMBULATORY_CARE_PROVIDER_SITE_OTHER): Payer: 59

## 2021-06-10 ENCOUNTER — Other Ambulatory Visit: Payer: Self-pay

## 2021-06-10 DIAGNOSIS — E876 Hypokalemia: Secondary | ICD-10-CM | POA: Diagnosis not present

## 2021-06-10 DIAGNOSIS — E559 Vitamin D deficiency, unspecified: Secondary | ICD-10-CM

## 2021-06-10 LAB — BASIC METABOLIC PANEL
BUN: 9 mg/dL (ref 6–23)
CO2: 29 mEq/L (ref 19–32)
Calcium: 9.6 mg/dL (ref 8.4–10.5)
Chloride: 99 mEq/L (ref 96–112)
Creatinine, Ser: 0.6 mg/dL (ref 0.40–1.20)
GFR: 116.92 mL/min (ref >60.00)
Glucose, Bld: 107 mg/dL — ABNORMAL HIGH (ref 70–99)
Potassium: 3.4 mEq/L — ABNORMAL LOW (ref 3.5–5.1)
Sodium: 137 mEq/L (ref 135–145)

## 2021-06-10 LAB — VITAMIN D 25 HYDROXY (VIT D DEFICIENCY, FRACTURES): VITD: 11.3 ng/mL — ABNORMAL LOW (ref 30.00–100.00)

## 2021-06-11 ENCOUNTER — Other Ambulatory Visit: Payer: Self-pay | Admitting: Family

## 2021-06-11 MED ORDER — VITAMIN D (ERGOCALCIFEROL) 1.25 MG (50000 UNIT) PO CAPS: 50000.0000 [IU] | ORAL_CAPSULE | ORAL | 0 refills | Status: AC

## 2022-04-12 ENCOUNTER — Other Ambulatory Visit: Payer: Self-pay | Admitting: Family

## 2022-05-03 ENCOUNTER — Ambulatory Visit: Payer: Self-pay | Admitting: Family

## 2022-05-10 ENCOUNTER — Encounter: Payer: Self-pay | Admitting: Family

## 2022-05-10 ENCOUNTER — Ambulatory Visit (INDEPENDENT_AMBULATORY_CARE_PROVIDER_SITE_OTHER): Payer: Managed Care, Other (non HMO) | Admitting: Family

## 2022-05-10 VITALS — BP 122/70 | HR 66 | Temp 98.7°F | Resp 18 | Ht 63.0 in | Wt 218.8 lb

## 2022-05-10 DIAGNOSIS — R109 Unspecified abdominal pain: Secondary | ICD-10-CM | POA: Diagnosis not present

## 2022-05-10 DIAGNOSIS — Z13220 Encounter for screening for lipoid disorders: Secondary | ICD-10-CM | POA: Diagnosis not present

## 2022-05-10 DIAGNOSIS — Z0000 Encounter for general adult medical examination without abnormal findings: Secondary | ICD-10-CM | POA: Diagnosis not present

## 2022-05-10 DIAGNOSIS — E559 Vitamin D deficiency, unspecified: Secondary | ICD-10-CM

## 2022-05-10 MED ORDER — AMLODIPINE BESYLATE 10 MG PO TABS: 10.0000 mg | ORAL_TABLET | Freq: Every day | ORAL | 3 refills | Status: DC

## 2022-05-10 MED ORDER — POTASSIUM CHLORIDE CRYS ER 20 MEQ PO TBCR: 20.0000 meq | EXTENDED_RELEASE_TABLET | Freq: Every day | ORAL | 3 refills | Status: DC

## 2022-05-10 MED ORDER — ALBUTEROL SULFATE HFA 108 (90 BASE) MCG/ACT IN AERS
1.0000 | INHALATION_SPRAY | Freq: Four times a day (QID) | RESPIRATORY_TRACT | 1 refills | Status: DC | PRN
Start: 2022-05-10 — End: 2023-06-29

## 2022-05-10 MED ORDER — LOSARTAN POTASSIUM-HCTZ 100-25 MG PO TABS: 1.0000 | ORAL_TABLET | Freq: Every day | ORAL | 3 refills | Status: DC

## 2022-05-10 MED ORDER — FLUTICASONE FUROATE-VILANTEROL 100-25 MCG/ACT IN AEPB: 1.0000 | INHALATION_SPRAY | Freq: Every day | RESPIRATORY_TRACT | 1 refills | Status: DC

## 2022-05-10 NOTE — Progress Notes (Signed)
Cynthia Carpenter is a 36 y.o. female with the following history as recorded in EpicCare:  Patient Active Problem List   Diagnosis Date Noted   Seasonal and perennial allergic rhinoconjunctivitis 04/27/2020   Menorrhagia 09/23/2019   Fibroid, uterine 09/17/2019   Moderate persistent asthma without complication 44/12/270   Adverse food reaction 05/30/2019   Other atopic dermatitis 05/30/2019   History of unprotected sex 01/08/2013   FH: breast cancer in first degree relative 01/08/2013   Obesity 12/27/2012   HIDRADENITIS SUPPURATIVA 03/30/2010   Hypertension 03/30/2010    Current Outpatient Medications  Medication Sig Dispense Refill   Alcaftadine 0.25 % SOLN Apply 1 drop to eye daily as needed (itchy/watery eyes). 3 mL 5   azelastine (ASTELIN) 0.1 % nasal spray Place 1-2 sprays into both nostrils 2 (two) times daily as needed (runny nose). Use in each nostril as directed 30 mL 5   fluticasone (FLONASE) 50 MCG/ACT nasal spray Place 1 spray into both nostrils in the morning and at bedtime. For nasal congestion 16 g 5   fluticasone furoate-vilanterol (BREO ELLIPTA) 100-25 MCG/ACT AEPB Inhale 1 puff into the lungs daily. 1 each 1   levocetirizine (XYZAL) 5 MG tablet Take 5 mg by mouth every evening.     linaclotide (LINZESS) 72 MCG capsule Take 1 capsule (72 mcg total) by mouth daily before breakfast. 30 capsule 3   sodium chloride (OCEAN) 0.65 % SOLN nasal spray Place 1 spray into both nostrils as needed for congestion. 15 mL 0   albuterol (VENTOLIN HFA) 108 (90 Base) MCG/ACT inhaler Inhale 1-2 puffs into the lungs every 6 (six) hours as needed for wheezing or shortness of breath. 18 g 1   amLODipine (NORVASC) 10 MG tablet Take 1 tablet (10 mg total) by mouth daily. 90 tablet 3   losartan-hydrochlorothiazide (HYZAAR) 100-25 MG tablet Take 1 tablet by mouth daily. 90 tablet 3   potassium chloride SA (KLOR-CON M20) 20 MEQ tablet Take 1 tablet (20 mEq total) by mouth daily. 90 tablet 3    No current facility-administered medications for this visit.    Allergies: Patient has no known allergies.  Past Medical History:  Diagnosis Date   Asthma    Eczema    Hypertension    Obesity    Urticaria     Past Surgical History:  Procedure Laterality Date   CYSTOSCOPY  09/23/2019   Procedure: Cystoscopy;  Surgeon: Everett Graff, MD;  Location: [facility];  Service: Gynecology;;   LAPAROSCOPIC VAGINAL HYSTERECTOMY WITH SALPINGECTOMY Bilateral 09/23/2019   Procedure: LAPAROSCOPIC ASSISTED VAGINAL HYSTERECTOMY WITH SALPINGECTOMY;  Surgeon: Everett Graff, MD;  Location: [facility];  Service: Gynecology;  Laterality: Bilateral;   TUBAL LIGATION     TYMPANOSTOMY TUBE PLACEMENT      Family History  Problem Relation Age of Onset   Arthritis Mother    Hyperlipidemia Mother    Hyperthyroidism Mother    Eczema Mother    Breast cancer Mother    Alcohol abuse Father    Hypertension Father    Drug abuse Father    Stroke Father    Diabetes Brother    Hyperlipidemia Maternal Grandmother    Hypertension Maternal Grandmother    Ovarian cancer Maternal Grandmother    Depression Paternal Grandmother    Heart disease Paternal Grandmother    Stroke Paternal Grandmother    Diabetes Maternal Grandfather    Diabetes Maternal Uncle    Diabetes Maternal Uncle    Stroke Maternal Aunt    Eczema Son  Colon cancer Neg Hx    Esophageal cancer Neg Hx    Rectal cancer Neg Hx    Stomach cancer Neg Hx     Social History   Tobacco Use   Smoking status: Never   Smokeless tobacco: Never  Substance Use Topics   Alcohol use: Yes    Comment: socially    Subjective:   Presents for yearly CPE; does need fasting labs and refills updated;  Also notes 2 different episodes of concerning abdominal pain- 1st episode early March 2023 and only lasted for a few seconds; 2nd episode was last week and was so severe that she was double over in pain for approximately 5 minutes; once episode passed, she was  "fine" with no lingering pain or nausea/ vomiting or changes in bowel habits; no history of kidney stones/ no FH of gallbladder disease;  Review of Systems  Constitutional:  Positive for weight loss.       Planned- exercising regularly  HENT: Negative.    Eyes: Negative.   Respiratory: Negative.    Cardiovascular: Negative.   Gastrointestinal:  Positive for abdominal pain.  Genitourinary: Negative.   Musculoskeletal: Negative.   Skin: Negative.   Neurological: Negative.   Endo/Heme/Allergies: Negative.   Psychiatric/Behavioral: Negative.         Objective:  Vitals:   05/10/22 1509  BP: 122/70  Pulse: 66  Resp: 18  Temp: 98.7 F (37.1 C)  TempSrc: Oral  SpO2: 99%  Weight: 218 lb 12.8 oz (99.2 kg)  Height: _0  (1.6 m)    General: Well developed, well nourished, in no acute distress  Skin : Warm and dry.  Head: Normocephalic and atraumatic  Eyes: Sclera and conjunctiva clear; pupils round and reactive to light; extraocular movements intact  Ears: External normal; canals clear; tympanic membranes normal  Oropharynx: Pink, supple. No suspicious lesions  Neck: Supple without thyromegaly, adenopathy  Lungs: Respirations unlabored; clear to auscultation bilaterally without wheeze, rales, rhonchi  CVS exam: normal rate and regular rhythm.  Abdomen: Soft; nontender; nondistended; normoactive bowel sounds; no masses or hepatosplenomegaly  Musculoskeletal: No deformities; no active joint inflammation  Extremities: No edema, cyanosis, clubbing  Vessels: Symmetric bilaterally  Neurologic: Alert and oriented; speech intact; face symmetrical; moves all extremities well; CNII-XII intact without focal deficit  Assessment:  1. PE (physical exam), annual   2. Lipid screening   3. Vitamin D deficiency   4. Abdominal pain, unspecified abdominal location     Plan:  Age appropriate preventive healthcare needs addressed; encouraged regular eye doctor and dental exams; encouraged  regular exercise; will update labs and refills as needed today; follow-up to be determined;  For abdominal pain- discussed concerning presentation and worsening symptoms; would like to get abd/ pelvic CT; follow up to be determined;   No follow-ups on file.  Orders Placed This Encounter  Procedures   CT Abdomen Pelvis W Contrast    Standing Status:   Future    Standing Expiration Date:   05/11/2023    Order Specific Question:   If indicated for the ordered procedure, I authorize the administration of contrast media per Radiology protocol    Answer:   Yes    Order Specific Question:   Is patient pregnant?    Answer:   No    Order Specific Question:   Preferred imaging location?    Answer:   [facility] Specific Question:   Is Oral Contrast requested for this exam?  Answer:   Yes, Per Radiology protocol   CBC with Differential/Platelet   Comp Met (CMET)   Lipid panel   Vitamin D (25 hydroxy)    Requested Prescriptions   Signed Prescriptions Disp Refills   losartan-hydrochlorothiazide (HYZAAR) 100-25 MG tablet 90 tablet 3    Sig: Take 1 tablet by mouth daily.   amLODipine (NORVASC) 10 MG tablet 90 tablet 3    Sig: Take 1 tablet (10 mg total) by mouth daily.   fluticasone furoate-vilanterol (BREO ELLIPTA) 100-25 MCG/ACT AEPB 1 each 1    Sig: Inhale 1 puff into the lungs daily.   albuterol (VENTOLIN HFA) 108 (90 Base) MCG/ACT inhaler 18 g 1    Sig: Inhale 1-2 puffs into the lungs every 6 (six) hours as needed for wheezing or shortness of breath.   potassium chloride SA (KLOR-CON M20) 20 MEQ tablet 90 tablet 3    Sig: Take 1 tablet (20 mEq total) by mouth daily.

## 2022-05-11 ENCOUNTER — Other Ambulatory Visit: Payer: Self-pay | Admitting: Family

## 2022-05-11 ENCOUNTER — Encounter: Payer: Self-pay | Admitting: Family

## 2022-05-11 ENCOUNTER — Telehealth: Payer: Self-pay

## 2022-05-11 LAB — COMPREHENSIVE METABOLIC PANEL
ALT: 13 U/L (ref 0–35)
AST: 16 U/L (ref 0–37)
Albumin: 4.3 g/dL (ref 3.5–5.2)
Alkaline Phosphatase: 86 U/L (ref 39–117)
BUN: 7 mg/dL (ref 6–23)
CO2: 28 mEq/L (ref 19–32)
Calcium: 9.7 mg/dL (ref 8.4–10.5)
Chloride: 98 mEq/L (ref 96–112)
Creatinine, Ser: 0.63 mg/dL (ref 0.40–1.20)
GFR: 114.81 mL/min (ref >60.00)
Glucose, Bld: 80 mg/dL (ref 70–99)
Potassium: 3.4 mEq/L — ABNORMAL LOW (ref 3.5–5.1)
Sodium: 135 mEq/L (ref 135–145)
Total Bilirubin: 0.6 mg/dL (ref 0.2–1.2)
Total Protein: 7.9 g/dL (ref 6.0–8.3)

## 2022-05-11 LAB — CBC WITH DIFFERENTIAL/PLATELET
Basophils Absolute: 0.1 10*3/uL (ref 0.0–0.1)
Basophils Relative: 1 % (ref 0.0–3.0)
Eosinophils Absolute: 0.4 10*3/uL (ref 0.0–0.7)
Eosinophils Relative: 4 % (ref 0.0–5.0)
HCT: 37.7 % (ref 36.0–46.0)
Hemoglobin: 12.5 g/dL (ref 12.0–15.0)
Lymphocytes Relative: 24.1 % (ref 12.0–46.0)
Lymphs Abs: 2.2 10*3/uL (ref 0.7–4.0)
MCHC: 33.2 g/dL (ref 30.0–36.0)
MCV: 81.7 fl (ref 78.0–100.0)
Monocytes Absolute: 0.4 10*3/uL (ref 0.1–1.0)
Monocytes Relative: 4.6 % (ref 3.0–12.0)
Neutro Abs: 6.1 10*3/uL (ref 1.4–7.7)
Neutrophils Relative %: 66.3 % (ref 43.0–77.0)
Platelets: 362 10*3/uL (ref 150.0–400.0)
RBC: 4.62 Mil/uL (ref 3.87–5.11)
RDW: 13.8 % (ref 11.5–15.5)
WBC: 9.2 10*3/uL (ref 4.0–10.5)

## 2022-05-11 LAB — LIPID PANEL
Cholesterol: 164 mg/dL (ref 0–200)
HDL: 50.3 mg/dL (ref >39.00)
LDL Cholesterol: 97 mg/dL (ref 0–99)
NonHDL: 114.15
Total CHOL/HDL Ratio: 3
Triglycerides: 88 mg/dL (ref 0.0–149.0)
VLDL: 17.6 mg/dL (ref 0.0–40.0)

## 2022-05-11 LAB — VITAMIN D 25 HYDROXY (VIT D DEFICIENCY, FRACTURES): VITD: 8.94 ng/mL — ABNORMAL LOW (ref 30.00–100.00)

## 2022-05-11 MED ORDER — VITAMIN D (ERGOCALCIFEROL) 1.25 MG (50000 UNIT) PO CAPS: 50000.0000 [IU] | ORAL_CAPSULE | ORAL | 0 refills | Status: AC

## 2022-05-12 ENCOUNTER — Telehealth: Payer: Self-pay | Admitting: Family

## 2022-05-12 ENCOUNTER — Other Ambulatory Visit: Payer: Self-pay | Admitting: Family

## 2022-05-12 DIAGNOSIS — E559 Vitamin D deficiency, unspecified: Secondary | ICD-10-CM

## 2022-05-12 NOTE — Telephone Encounter (Signed)
Psychiatric nurse requesting rx refills  Medication:  losartan-hydrochlorothiazide (HYZAAR) 100-25 MG tablet potassium chloride SA (KLOR-CON M20) 20 MEQ tablet amLODipine (NORVASC) 10 MG tablet   Has the patient contacted their pharmacy? Yes.      Preferred Pharmacy (with phone number or street name):  [facility]  [address] [place] [address] [place]: [phone] Fax: [phone]

## 2022-05-12 NOTE — Telephone Encounter (Signed)
Spoke with patient and she stated that she does not want to use this pharmacy.  [place] and asked them to remove from them and pharmacy tech stated that she will take pt out of system.

## 2022-05-13 ENCOUNTER — Other Ambulatory Visit: Payer: Self-pay

## 2022-05-13 MED ORDER — BREO ELLIPTA 100-25 MCG/ACT IN AEPB: 1.0000 | INHALATION_SPRAY | Freq: Every day | RESPIRATORY_TRACT | 1 refills | Status: DC

## 2022-05-13 NOTE — Telephone Encounter (Signed)
Done

## 2022-05-20 ENCOUNTER — Other Ambulatory Visit: Payer: Self-pay | Admitting: Family

## 2022-05-20 DIAGNOSIS — R109 Unspecified abdominal pain: Secondary | ICD-10-CM

## 2022-05-27 ENCOUNTER — Ambulatory Visit (HOSPITAL_BASED_OUTPATIENT_CLINIC_OR_DEPARTMENT_OTHER)
Admission: RE | Admit: 2022-05-27 | Discharge: 2022-05-27 | Disposition: A | Discharge status: Home / Self Care | Payer: Managed Care, Other (non HMO) | Source: Ambulatory Visit | Attending: Family | Admitting: Family

## 2022-05-27 DIAGNOSIS — R109 Unspecified abdominal pain: Secondary | ICD-10-CM | POA: Insufficient documentation

## 2022-07-12 ENCOUNTER — Other Ambulatory Visit: Payer: Managed Care, Other (non HMO)

## 2023-05-27 ENCOUNTER — Other Ambulatory Visit: Payer: Self-pay | Admitting: Family

## 2023-05-29 ENCOUNTER — Encounter: Payer: Self-pay | Admitting: *Deleted

## 2023-06-29 ENCOUNTER — Encounter: Payer: Self-pay | Admitting: Family

## 2023-06-29 ENCOUNTER — Ambulatory Visit (INDEPENDENT_AMBULATORY_CARE_PROVIDER_SITE_OTHER): Payer: BC Managed Care – PPO | Admitting: Family

## 2023-06-29 VITALS — BP 124/86 | HR 73 | Ht 63.0 in | Wt 208.4 lb

## 2023-06-29 DIAGNOSIS — E559 Vitamin D deficiency, unspecified: Secondary | ICD-10-CM | POA: Diagnosis not present

## 2023-06-29 DIAGNOSIS — Z13220 Encounter for screening for lipoid disorders: Secondary | ICD-10-CM | POA: Diagnosis not present

## 2023-06-29 DIAGNOSIS — Z1231 Encounter for screening mammogram for malignant neoplasm of breast: Secondary | ICD-10-CM

## 2023-06-29 DIAGNOSIS — Z803 Family history of malignant neoplasm of breast: Secondary | ICD-10-CM

## 2023-06-29 DIAGNOSIS — Z0000 Encounter for general adult medical examination without abnormal findings: Secondary | ICD-10-CM | POA: Diagnosis not present

## 2023-06-29 MED ORDER — POTASSIUM CHLORIDE CRYS ER 20 MEQ PO TBCR: 20.0000 meq | EXTENDED_RELEASE_TABLET | Freq: Every day | ORAL | 3 refills | Status: DC

## 2023-06-29 MED ORDER — LOSARTAN POTASSIUM-HCTZ 100-25 MG PO TABS: 1.0000 | ORAL_TABLET | Freq: Every day | ORAL | 3 refills | Status: DC

## 2023-06-29 MED ORDER — FLUTICASONE FUROATE-VILANTEROL 100-25 MCG/ACT IN AEPB: 1.0000 | INHALATION_SPRAY | Freq: Every day | RESPIRATORY_TRACT | 11 refills | Status: DC

## 2023-06-29 MED ORDER — ALBUTEROL SULFATE HFA 108 (90 BASE) MCG/ACT IN AERS: 1.0000 | INHALATION_SPRAY | Freq: Four times a day (QID) | RESPIRATORY_TRACT | 1 refills | Status: DC | PRN

## 2023-06-29 MED ORDER — LEVOCETIRIZINE DIHYDROCHLORIDE 5 MG PO TABS: 5.0000 mg | ORAL_TABLET | Freq: Every evening | ORAL | 11 refills | Status: DC

## 2023-06-29 MED ORDER — OLOPATADINE HCL 0.1 % OP SOLN: 1.0000 [drp] | Freq: Two times a day (BID) | OPHTHALMIC | 12 refills | Status: AC

## 2023-06-29 MED ORDER — AZELASTINE HCL 0.1 % NA SOLN: 1.0000 | Freq: Two times a day (BID) | NASAL | 11 refills | Status: AC | PRN

## 2023-06-29 MED ORDER — FLUTICASONE PROPIONATE 50 MCG/ACT NA SUSP: 1.0000 | Freq: Two times a day (BID) | NASAL | 11 refills | Status: AC

## 2023-06-29 MED ORDER — LINACLOTIDE 72 MCG PO CAPS: 72.0000 ug | ORAL_CAPSULE | Freq: Every day | ORAL | 11 refills | Status: DC

## 2023-06-29 NOTE — Progress Notes (Signed)
Cynthia Carpenter is a 37 y.o. female with the following history as recorded in EpicCare:  Patient Active Problem List   Diagnosis Date Noted   Seasonal and perennial allergic rhinoconjunctivitis 04/27/2020   Menorrhagia 09/23/2019   Fibroid, uterine 09/17/2019   Moderate persistent asthma without complication 05/30/2019   Adverse food reaction 05/30/2019   Other atopic dermatitis 05/30/2019   History of unprotected sex 01/08/2013   FH: breast cancer in first degree relative 01/08/2013   Obesity 12/27/2012   HIDRADENITIS SUPPURATIVA 03/30/2010   Hypertension 03/30/2010    Current Outpatient Medications  Medication Sig Dispense Refill   amLODipine (NORVASC) 10 MG tablet Take 1 tablet by mouth once daily 90 tablet 0   olopatadine (PATADAY) 0.1 % ophthalmic solution Place 1 drop into both eyes 2 (two) times daily. 5 mL 12   sodium chloride (OCEAN) 0.65 % SOLN nasal spray Place 1 spray into both nostrils as needed for congestion. 15 mL 0   albuterol (VENTOLIN HFA) 108 (90 Base) MCG/ACT inhaler Inhale 1-2 puffs into the lungs every 6 (six) hours as needed for wheezing or shortness of breath. 18 g 1   azelastine (ASTELIN) 0.1 % nasal spray Place 1-2 sprays into both nostrils 2 (two) times daily as needed (runny nose). Use in each nostril as directed 30 mL 11   fluticasone (FLONASE) 50 MCG/ACT nasal spray Place 1 spray into both nostrils in the morning and at bedtime. For nasal congestion 16 g 11   fluticasone furoate-vilanterol (BREO ELLIPTA) 100-25 MCG/ACT AEPB Inhale 1 puff into the lungs daily. 1 each 11   levocetirizine (XYZAL) 5 MG tablet Take 1 tablet (5 mg total) by mouth every evening. 30 tablet 11   linaclotide (LINZESS) 72 MCG capsule Take 1 capsule (72 mcg total) by mouth daily before breakfast. 30 capsule 11   losartan-hydrochlorothiazide (HYZAAR) 100-25 MG tablet Take 1 tablet by mouth daily. 90 tablet 3   potassium chloride SA (KLOR-CON M20) 20 MEQ tablet Take 1 tablet (20 mEq  total) by mouth daily. 90 tablet 3   No current facility-administered medications for this visit.    Allergies: Patient has no known allergies.  Past Medical History:  Diagnosis Date   Asthma    Eczema    Hypertension    Obesity    Urticaria     Past Surgical History:  Procedure Laterality Date   CYSTOSCOPY  09/23/2019   Procedure: Cystoscopy;  Surgeon: Osborn Coho, MD;  Location: [facility];  Service: Gynecology;;   LAPAROSCOPIC VAGINAL HYSTERECTOMY WITH SALPINGECTOMY Bilateral 09/23/2019   Procedure: LAPAROSCOPIC ASSISTED VAGINAL HYSTERECTOMY WITH SALPINGECTOMY;  Surgeon: Osborn Coho, MD;  Location: [facility] OR;  Service: Gynecology;  Laterality: Bilateral;   TUBAL LIGATION     TYMPANOSTOMY TUBE PLACEMENT      Family History  Problem Relation Age of Onset   Arthritis Mother    Hyperlipidemia Mother    Hyperthyroidism Mother    Eczema Mother    Breast cancer Mother    Alcohol abuse Father    Hypertension Father    Drug abuse Father    Stroke Father    Diabetes Brother    Hyperlipidemia Maternal Grandmother    Hypertension Maternal Grandmother    Ovarian cancer Maternal Grandmother    Depression Paternal Grandmother    Heart disease Paternal Grandmother    Stroke Paternal Grandmother    Diabetes Maternal Grandfather    Diabetes Maternal Uncle    Diabetes Maternal Uncle    Stroke Maternal Aunt  Eczema Son    Colon cancer Neg Hx    Esophageal cancer Neg Hx    Rectal cancer Neg Hx    Stomach cancer Neg Hx     Social History   Tobacco Use   Smoking status: Never   Smokeless tobacco: Never  Substance Use Topics   Alcohol use: Yes    Comment: socially    Subjective:   Presents for yearly CPE; has lost 10 pounds since OV last year; would like to get mammogram updated- mother diagnosed at age 63/ unfortunately passed away 15 years ago;   Review of Systems  Constitutional:  Positive for weight loss.       Planned weight loss  HENT: Negative.    Eyes: Negative.    Respiratory: Negative.    Cardiovascular: Negative.   Gastrointestinal: Negative.   Genitourinary: Negative.   Musculoskeletal: Negative.   Skin: Negative.   Neurological: Negative.   Endo/Heme/Allergies: Negative.   Psychiatric/Behavioral: Negative.        Objective:  Vitals:   06/29/23 1307  BP: 124/86  Pulse: 73  SpO2: 100%  Weight: 208 lb 6.4 oz (94.5 kg)  Height: 5\' 3"  (1.6 m)    General: Well developed, well nourished, in no acute distress  Skin : Warm and dry.  Head: Normocephalic and atraumatic  Eyes: Sclera and conjunctiva clear; pupils round and reactive to light; extraocular movements intact  Ears: External normal; canals clear; tympanic membranes normal  Oropharynx: Pink, supple. No suspicious lesions  Neck: Supple without thyromegaly, adenopathy  Lungs: Respirations unlabored; clear to auscultation bilaterally without wheeze, rales, rhonchi  CVS exam: normal rate and regular rhythm.  Abdomen: Soft; nontender; nondistended; normoactive bowel sounds; no masses or hepatosplenomegaly  Musculoskeletal: No deformities; no active joint inflammation  Extremities: No edema, cyanosis, clubbing  Vessels: Symmetric bilaterally  Neurologic: Alert and oriented; speech intact; face symmetrical; moves all extremities well; CNII-XII intact without focal deficit   Assessment:  1. PE (physical exam), annual   2. Lipid screening   3. Vitamin D deficiency   4. Visit for screening mammogram   5. FH: breast cancer     Plan:  Age appropriate preventive healthcare needs addressed; encouraged regular eye doctor and dental exams; encouraged regular exercise; will update labs and refills as needed today; follow-up to be determined;   No follow-ups on file.  Orders Placed This Encounter  Procedures   MM Digital Screening    Standing Status:   Future    Standing Expiration Date:   06/28/2024    Order Specific Question:   Reason for Exam (SYMPTOM  OR DIAGNOSIS REQUIRED)     Answer:   screening mammogram/ FH of breast cancer- mother diagnosed at 65    Order Specific Question:   Is the patient pregnant?    Answer:   No    Order Specific Question:   Preferred imaging location?    Answer:   [facility]   CBC with Differential/Platelet   Comp Met (CMET)   Lipid panel   Vitamin D (25 hydroxy)    Requested Prescriptions   Signed Prescriptions Disp Refills   fluticasone furoate-vilanterol (BREO ELLIPTA) 100-25 MCG/ACT AEPB 1 each 11    Sig: Inhale 1 puff into the lungs daily.   albuterol (VENTOLIN HFA) 108 (90 Base) MCG/ACT inhaler 18 g 1    Sig: Inhale 1-2 puffs into the lungs every 6 (six) hours as needed for wheezing or shortness of breath.   linaclotide (LINZESS) 72 MCG capsule  30 capsule 11    Sig: Take 1 capsule (72 mcg total) by mouth daily before breakfast.   azelastine (ASTELIN) 0.1 % nasal spray 30 mL 11    Sig: Place 1-2 sprays into both nostrils 2 (two) times daily as needed (runny nose). Use in each nostril as directed   fluticasone (FLONASE) 50 MCG/ACT nasal spray 16 g 11    Sig: Place 1 spray into both nostrils in the morning and at bedtime. For nasal congestion   olopatadine (PATADAY) 0.1 % ophthalmic solution 5 mL 12    Sig: Place 1 drop into both eyes 2 (two) times daily.   levocetirizine (XYZAL) 5 MG tablet 30 tablet 11    Sig: Take 1 tablet (5 mg total) by mouth every evening.   losartan-hydrochlorothiazide (HYZAAR) 100-25 MG tablet 90 tablet 3    Sig: Take 1 tablet by mouth daily.   potassium chloride SA (KLOR-CON M20) 20 MEQ tablet 90 tablet 3    Sig: Take 1 tablet (20 mEq total) by mouth daily.

## 2023-06-30 ENCOUNTER — Other Ambulatory Visit: Payer: Self-pay | Admitting: Family

## 2023-06-30 DIAGNOSIS — D649 Anemia, unspecified: Secondary | ICD-10-CM

## 2023-06-30 DIAGNOSIS — E559 Vitamin D deficiency, unspecified: Secondary | ICD-10-CM

## 2023-06-30 LAB — CBC WITH DIFFERENTIAL/PLATELET
Basophils Absolute: 0.1 10*3/uL (ref 0.0–0.1)
Basophils Relative: 0.9 % (ref 0.0–3.0)
Eosinophils Absolute: 0.3 10*3/uL (ref 0.0–0.7)
Eosinophils Relative: 3.3 % (ref 0.0–5.0)
HCT: 36 % (ref 36.0–46.0)
Hemoglobin: 11.7 g/dL — ABNORMAL LOW (ref 12.0–15.0)
Lymphocytes Relative: 27.9 % (ref 12.0–46.0)
Lymphs Abs: 2.2 10*3/uL (ref 0.7–4.0)
MCHC: 32.6 g/dL (ref 30.0–36.0)
MCV: 82.6 fl (ref 78.0–100.0)
Monocytes Absolute: 0.5 10*3/uL (ref 0.1–1.0)
Monocytes Relative: 6.6 % (ref 3.0–12.0)
Neutro Abs: 4.8 10*3/uL (ref 1.4–7.7)
Neutrophils Relative %: 61.3 % (ref 43.0–77.0)
Platelets: 381 10*3/uL (ref 150.0–400.0)
RBC: 4.36 Mil/uL (ref 3.87–5.11)
RDW: 14 % (ref 11.5–15.5)
WBC: 7.8 10*3/uL (ref 4.0–10.5)

## 2023-06-30 LAB — COMPREHENSIVE METABOLIC PANEL
ALT: 12 U/L (ref 0–35)
AST: 16 U/L (ref 0–37)
Albumin: 4.1 g/dL (ref 3.5–5.2)
Alkaline Phosphatase: 73 U/L (ref 39–117)
BUN: 6 mg/dL (ref 6–23)
CO2: 26 mEq/L (ref 19–32)
Calcium: 9.7 mg/dL (ref 8.4–10.5)
Chloride: 102 mEq/L (ref 96–112)
Creatinine, Ser: 0.63 mg/dL (ref 0.40–1.20)
GFR: 113.9 mL/min (ref >60.00)
Glucose, Bld: 84 mg/dL (ref 70–99)
Potassium: 3.4 mEq/L — ABNORMAL LOW (ref 3.5–5.1)
Sodium: 138 mEq/L (ref 135–145)
Total Bilirubin: 0.4 mg/dL (ref 0.2–1.2)
Total Protein: 7.2 g/dL (ref 6.0–8.3)

## 2023-06-30 LAB — LIPID PANEL
Cholesterol: 136 mg/dL (ref 0–200)
HDL: 43 mg/dL (ref >39.00)
LDL Cholesterol: 73 mg/dL (ref 0–99)
NonHDL: 93.02
Total CHOL/HDL Ratio: 3
Triglycerides: 98 mg/dL (ref 0.0–149.0)
VLDL: 19.6 mg/dL (ref 0.0–40.0)

## 2023-06-30 LAB — VITAMIN D 25 HYDROXY (VIT D DEFICIENCY, FRACTURES): VITD: <7 ng/mL — ABNORMAL LOW (ref 30.00–100.00)

## 2023-06-30 MED ORDER — VITAMIN D (ERGOCALCIFEROL) 1.25 MG (50000 UNIT) PO CAPS: 50000.0000 [IU] | ORAL_CAPSULE | ORAL | 0 refills | Status: AC

## 2023-07-05 ENCOUNTER — Ambulatory Visit
Admission: RE | Admit: 2023-07-05 | Discharge: 2023-07-05 | Disposition: A | Discharge status: Home / Self Care | Payer: BC Managed Care – PPO | Source: Ambulatory Visit | Attending: Family | Admitting: Family

## 2023-07-05 DIAGNOSIS — Z803 Family history of malignant neoplasm of breast: Secondary | ICD-10-CM

## 2023-07-05 DIAGNOSIS — Z1231 Encounter for screening mammogram for malignant neoplasm of breast: Secondary | ICD-10-CM

## 2024-01-16 ENCOUNTER — Other Ambulatory Visit: Payer: Self-pay | Admitting: Family

## 2024-07-07 ENCOUNTER — Other Ambulatory Visit: Payer: Self-pay | Admitting: Family

## 2024-07-12 ENCOUNTER — Ambulatory Visit: Admitting: Family

## 2024-07-12 ENCOUNTER — Other Ambulatory Visit (HOSPITAL_COMMUNITY)
Admission: RE | Admit: 2024-07-12 | Discharge: 2024-07-12 | Disposition: A | Discharge status: Home / Self Care | Source: Ambulatory Visit | Attending: Family | Admitting: Family

## 2024-07-12 VITALS — BP 126/74 | HR 78 | Resp 16 | Ht 63.0 in | Wt 215.8 lb

## 2024-07-12 DIAGNOSIS — Z124 Encounter for screening for malignant neoplasm of cervix: Secondary | ICD-10-CM | POA: Insufficient documentation

## 2024-07-12 DIAGNOSIS — Z1231 Encounter for screening mammogram for malignant neoplasm of breast: Secondary | ICD-10-CM

## 2024-07-12 DIAGNOSIS — I10 Essential (primary) hypertension: Secondary | ICD-10-CM

## 2024-07-12 DIAGNOSIS — Z13220 Encounter for screening for lipoid disorders: Secondary | ICD-10-CM | POA: Diagnosis not present

## 2024-07-12 DIAGNOSIS — Z0000 Encounter for general adult medical examination without abnormal findings: Secondary | ICD-10-CM

## 2024-07-12 DIAGNOSIS — E010 Iodine-deficiency related diffuse (endemic) goiter: Secondary | ICD-10-CM

## 2024-07-12 DIAGNOSIS — E559 Vitamin D deficiency, unspecified: Secondary | ICD-10-CM | POA: Diagnosis not present

## 2024-07-12 DIAGNOSIS — J309 Allergic rhinitis, unspecified: Secondary | ICD-10-CM

## 2024-07-12 LAB — CBC WITH DIFFERENTIAL/PLATELET
Basophils Absolute: 0 K/uL (ref 0.0–0.1)
Basophils Relative: 0.3 % (ref 0.0–3.0)
Eosinophils Absolute: 0.2 K/uL (ref 0.0–0.7)
Eosinophils Relative: 2.1 % (ref 0.0–5.0)
HCT: 40.2 % (ref 36.0–46.0)
Hemoglobin: 13.3 g/dL (ref 12.0–15.0)
Lymphocytes Relative: 24.7 % (ref 12.0–46.0)
Lymphs Abs: 2.1 K/uL (ref 0.7–4.0)
MCHC: 33.1 g/dL (ref 30.0–36.0)
MCV: 81.9 fl (ref 78.0–100.0)
Monocytes Absolute: 0.5 K/uL (ref 0.1–1.0)
Monocytes Relative: 5.5 % (ref 3.0–12.0)
Neutro Abs: 5.7 K/uL (ref 1.4–7.7)
Neutrophils Relative %: 67.4 % (ref 43.0–77.0)
Platelets: 359 K/uL (ref 150.0–400.0)
RBC: 4.91 Mil/uL (ref 3.87–5.11)
RDW: 13.9 % (ref 11.5–15.5)
WBC: 8.5 K/uL (ref 4.0–10.5)

## 2024-07-12 LAB — COMPREHENSIVE METABOLIC PANEL WITH GFR
ALT: 16 U/L (ref 0–35)
AST: 17 U/L (ref 0–37)
Albumin: 4.5 g/dL (ref 3.5–5.2)
Alkaline Phosphatase: 79 U/L (ref 39–117)
BUN: 9 mg/dL (ref 6–23)
CO2: 27 meq/L (ref 19–32)
Calcium: 9.8 mg/dL (ref 8.4–10.5)
Chloride: 100 meq/L (ref 96–112)
Creatinine, Ser: 0.7 mg/dL (ref 0.40–1.20)
GFR: 110.24 mL/min (ref >60.00)
Glucose, Bld: 81 mg/dL (ref 70–99)
Potassium: 3.5 meq/L (ref 3.5–5.1)
Sodium: 136 meq/L (ref 135–145)
Total Bilirubin: 0.6 mg/dL (ref 0.2–1.2)
Total Protein: 8.3 g/dL (ref 6.0–8.3)

## 2024-07-12 LAB — TSH: TSH: 2.06 u[IU]/mL (ref 0.35–5.50)

## 2024-07-12 LAB — LIPID PANEL
Cholesterol: 165 mg/dL (ref 0–200)
HDL: 55.6 mg/dL (ref >39.00)
LDL Cholesterol: 88 mg/dL (ref 0–99)
NonHDL: 109.29
Total CHOL/HDL Ratio: 3
Triglycerides: 105 mg/dL (ref 0.0–149.0)
VLDL: 21 mg/dL (ref 0.0–40.0)

## 2024-07-12 LAB — VITAMIN D 25 HYDROXY (VIT D DEFICIENCY, FRACTURES): VITD: 17.42 ng/mL — ABNORMAL LOW (ref 30.00–100.00)

## 2024-07-12 MED ORDER — AMLODIPINE BESYLATE 10 MG PO TABS: 10.0000 mg | ORAL_TABLET | Freq: Every day | ORAL | 3 refills | Status: AC

## 2024-07-12 MED ORDER — POTASSIUM CHLORIDE CRYS ER 20 MEQ PO TBCR: 20.0000 meq | EXTENDED_RELEASE_TABLET | Freq: Every day | ORAL | 3 refills | Status: DC

## 2024-07-12 MED ORDER — LINACLOTIDE 72 MCG PO CAPS: 72.0000 ug | ORAL_CAPSULE | Freq: Every day | ORAL | 11 refills | Status: AC

## 2024-07-12 MED ORDER — LOSARTAN POTASSIUM-HCTZ 100-25 MG PO TABS: 1.0000 | ORAL_TABLET | Freq: Every day | ORAL | 3 refills | Status: AC

## 2024-07-12 NOTE — Progress Notes (Signed)
 Cynthia Carpenter is a 38 y.o. female with the following history as recorded in [employer]:  Patient Active Problem List   Diagnosis Date Noted   Seasonal and perennial allergic rhinoconjunctivitis 04/27/2020   Menorrhagia 09/23/2019   Fibroid, uterine 09/17/2019   Moderate persistent asthma without complication 05/30/2019   Adverse food reaction 05/30/2019   Other atopic dermatitis 05/30/2019   History of unprotected sex 01/08/2013   FH: breast cancer in first degree relative 01/08/2013   Obesity 12/27/2012   HIDRADENITIS SUPPURATIVA 03/30/2010   Hypertension 03/30/2010    Current Outpatient Medications  Medication Sig Dispense Refill   albuterol  (VENTOLIN  HFA) 108 (90 Base) MCG/ACT inhaler Inhale 1-2 puffs into the lungs every 6 (six) hours as needed for wheezing or shortness of breath. 18 g 1   azelastine  (ASTELIN ) 0.1 % nasal spray Place 1-2 sprays into both nostrils 2 (two) times daily as needed (runny nose). Use in each nostril as directed 30 mL 11   BREO ELLIPTA  100-25 MCG/ACT AEPB Inhale 1 puff into the lungs daily. 180 each 0   fluticasone  (FLONASE ) 50 MCG/ACT nasal spray Place 1 spray into both nostrils in the morning and at bedtime. For nasal congestion 16 g 11   levocetirizine (XYZAL ) 5 MG tablet Take 1 tablet (5 mg total) by mouth every evening. 30 tablet 11   olopatadine  (PATADAY ) 0.1 % ophthalmic solution Place 1 drop into both eyes 2 (two) times daily. 5 mL 12   sodium chloride  (OCEAN) 0.65 % SOLN nasal spray Place 1 spray into both nostrils as needed for congestion. 15 mL 0   amLODipine  (NORVASC ) 10 MG tablet Take 1 tablet (10 mg total) by mouth daily. 90 tablet 3   linaclotide  (LINZESS ) 72 MCG capsule Take 1 capsule (72 mcg total) by mouth daily before breakfast. 30 capsule 11   losartan -hydrochlorothiazide  (HYZAAR) 100-25 MG tablet Take 1 tablet by mouth daily. 90 tablet 3   potassium chloride  SA (KLOR-CON  M20) 20 MEQ tablet Take 1 tablet (20 mEq total) by mouth daily.  90 tablet 3   No current facility-administered medications for this visit.    Allergies: Patient has no known allergies.  Past Medical History:  Diagnosis Date   Asthma    Eczema    Hypertension    Obesity    Urticaria     Past Surgical History:  Procedure Laterality Date   CYSTOSCOPY  09/23/2019   Procedure: Cystoscopy;  Surgeon: Henry Slough, MD;  Location: [facility] OR;  Service: Gynecology;;   LAPAROSCOPIC VAGINAL HYSTERECTOMY WITH SALPINGECTOMY Bilateral 09/23/2019   Procedure: LAPAROSCOPIC ASSISTED VAGINAL HYSTERECTOMY WITH SALPINGECTOMY;  Surgeon: Henry Slough, MD;  Location: [facility] OR;  Service: Gynecology;  Laterality: Bilateral;   TUBAL LIGATION     TYMPANOSTOMY TUBE PLACEMENT      Family History  Problem Relation Age of Onset   Arthritis Mother    Hyperlipidemia Mother    Hyperthyroidism Mother    Eczema Mother    Breast cancer Mother    Alcohol abuse Father    Hypertension Father    Drug abuse Father    Stroke Father    Diabetes Brother    Hyperlipidemia Maternal Grandmother    Hypertension Maternal Grandmother    Ovarian cancer Maternal Grandmother    Depression Paternal Grandmother    Heart disease Paternal Grandmother    Stroke Paternal Grandmother    Diabetes Maternal Grandfather    Diabetes Maternal Uncle    Diabetes Maternal Uncle    Stroke Maternal Aunt  Eczema Son    Colon cancer Neg Hx    Esophageal cancer Neg Hx    Rectal cancer Neg Hx    Stomach cancer Neg Hx     Social History   Tobacco Use   Smoking status: Never   Smokeless tobacco: Never  Substance Use Topics   Alcohol use: Yes    Comment: socially    Subjective:   Presents for yearly CPE; no acute concerns;   Review of Systems  Constitutional: Negative.   HENT: Negative.    Eyes: Negative.   Respiratory: Negative.    Cardiovascular: Negative.   Gastrointestinal: Negative.   Genitourinary: Negative.   Musculoskeletal: Negative.   Skin: Negative.   Neurological: Negative.    Endo/Heme/Allergies: Negative.   Psychiatric/Behavioral: Negative.       Objective:  Vitals:   07/12/24 1105  BP: 126/74  Pulse: 78  Resp: 16  SpO2: 98%  Weight: 215 lb 12.8 oz (97.9 kg)  Height: 5' 3 (1.6 m)    General: Well developed, well nourished, in no acute distress  Skin : Warm and dry.  Head: Normocephalic and atraumatic  Eyes: Sclera and conjunctiva clear; pupils round and reactive to light; extraocular movements intact  Ears: External normal; canals clear; tympanic membranes normal  Oropharynx: Pink, supple. No suspicious lesions  Neck: Supple without thyromegaly, adenopathy  Lungs: Respirations unlabored; clear to auscultation bilaterally without wheeze, rales, rhonchi  CVS exam: normal rate and regular rhythm.  Abdomen: Soft; nontender; nondistended; normoactive bowel sounds; no masses or hepatosplenomegaly  Musculoskeletal: No deformities; no active joint inflammation  Extremities: No edema, cyanosis, clubbing  Vessels: Symmetric bilaterally  Neurologic: Alert and oriented; speech intact; face symmetrical; moves all extremities well; CNII-XII intact without focal deficit   Assessment:  1. PE (physical exam), annual   2. Vitamin D  deficiency   3. Primary hypertension   4. Lipid screening   5. Visit for screening mammogram   6. Allergic rhinitis, unspecified seasonality, unspecified trigger   7. Cervical cancer screening   8. Thyromegaly     Plan:  Age appropriate preventive healthcare needs addressed; encouraged regular eye doctor and dental exams; encouraged regular exercise; will update labs and refills as needed today; follow-up to be determined based on TSH and thyroid  ultrasound;  Thin Prep pap collected;   No follow-ups on file.  Orders Placed This Encounter  Procedures   MM Digital Screening    Mother diagnosed at age 25    Standing Status:   Future    Expiration Date:   07/12/2025    Reason for Exam (SYMPTOM  OR DIAGNOSIS REQUIRED):   fh  breast cancer    Is the patient pregnant?:   No    Preferred imaging location?:   [facility]   US  THYROID     Standing Status:   Future    Expiration Date:   07/12/2025    Reason for Exam (SYMPTOM  OR DIAGNOSIS REQUIRED):   thyromegaly    Preferred imaging location?:   [facility]   Comprehensive metabolic panel with GFR   CBC with Differential/Platelet   Lipid panel   VITAMIN D  25 Hydroxy (Vit-D Deficiency, Fractures)   TSH   Ambulatory referral to Allergy     Referral Priority:   Routine    Referral Type:   Allergy  Testing    Referral Reason:   Specialty Services Required    Referred to Provider:   Cheryn Brian Norris, DO    Requested Specialty:  Allergy     Number of Visits Requested:   1    Requested Prescriptions   Signed Prescriptions Disp Refills   amLODipine  (NORVASC ) 10 MG tablet 90 tablet 3    Sig: Take 1 tablet (10 mg total) by mouth daily.   linaclotide  (LINZESS ) 72 MCG capsule 30 capsule 11    Sig: Take 1 capsule (72 mcg total) by mouth daily before breakfast.   losartan -hydrochlorothiazide  (HYZAAR) 100-25 MG tablet 90 tablet 3    Sig: Take 1 tablet by mouth daily.   potassium chloride  SA (KLOR-CON  M20) 20 MEQ tablet 90 tablet 3    Sig: Take 1 tablet (20 mEq total) by mouth daily.

## 2024-07-16 ENCOUNTER — Other Ambulatory Visit: Payer: Self-pay | Admitting: Family

## 2024-07-16 ENCOUNTER — Ambulatory Visit: Payer: Self-pay | Admitting: Family

## 2024-07-16 MED ORDER — VITAMIN D (ERGOCALCIFEROL) 1.25 MG (50000 UNIT) PO CAPS: 50000.0000 [IU] | ORAL_CAPSULE | ORAL | 0 refills | Status: AC

## 2024-07-17 ENCOUNTER — Other Ambulatory Visit: Payer: Self-pay | Admitting: Family

## 2024-07-17 LAB — CYTOLOGY - PAP
Comment: NEGATIVE
Diagnosis: NEGATIVE
High risk HPV: NEGATIVE

## 2024-07-17 MED ORDER — FLUCONAZOLE 150 MG PO TABS: ORAL_TABLET | ORAL | 0 refills | Status: AC

## 2024-07-18 ENCOUNTER — Encounter (HOSPITAL_BASED_OUTPATIENT_CLINIC_OR_DEPARTMENT_OTHER): Payer: Self-pay

## 2024-07-18 ENCOUNTER — Ambulatory Visit (HOSPITAL_BASED_OUTPATIENT_CLINIC_OR_DEPARTMENT_OTHER)
Admission: RE | Admit: 2024-07-18 | Discharge: 2024-07-18 | Disposition: A | Discharge status: Home / Self Care | Source: Ambulatory Visit | Attending: Family | Admitting: Family

## 2024-07-18 DIAGNOSIS — E010 Iodine-deficiency related diffuse (endemic) goiter: Secondary | ICD-10-CM

## 2024-07-18 DIAGNOSIS — Z1231 Encounter for screening mammogram for malignant neoplasm of breast: Secondary | ICD-10-CM | POA: Insufficient documentation

## 2024-08-14 ENCOUNTER — Other Ambulatory Visit: Payer: Self-pay | Admitting: Family

## 2024-12-10 ENCOUNTER — Other Ambulatory Visit: Payer: Self-pay | Admitting: Family

## 2024-12-10 MED ORDER — BREO ELLIPTA 100-25 MCG/ACT IN AEPB: 1.0000 | INHALATION_SPRAY | Freq: Every day | RESPIRATORY_TRACT | 0 refills | Status: AC

## 2024-12-10 MED ORDER — POTASSIUM CHLORIDE CRYS ER 20 MEQ PO TBCR: 20.0000 meq | EXTENDED_RELEASE_TABLET | Freq: Every day | ORAL | 3 refills | Status: AC

## 2024-12-10 NOTE — Telephone Encounter (Unsigned)
 Copied from CRM #[id number]. Topic: Clinical - Medication Refill >> Dec 10, 2024 12:02 PM Jayma L wrote: Medication: potassium chloride  SA (KLOR-CON  M20) 20 MEQ tablet BREO ELLIPTA  100-25 MCG/ACT AEPB  Has the patient contacted their pharmacy? Yes (Agent: If no, request that the patient contact the pharmacy for the refill. If patient does not wish to contact the pharmacy document the reason why and proceed with request.) (Agent: If yes, when and what did the pharmacy advise?)  This is the patient's preferred pharmacy:  [facility] [address] ([address]), [place] - [address] [place] ([place]) [place] [address] Phone: [phone] Fax: [phone]  Is this the correct pharmacy for this prescription? Yes If no, delete pharmacy and type the correct one.   Has the prescription been filled recently? No  Is the patient out of the medication? Yes  Has the patient been seen for an appointment in the last year OR does the patient have an upcoming appointment? Yes  Can we respond through MyChart? Yes  Agent: Please be advised that Rx refills may take up to 3 business days. We ask that you follow-up with your pharmacy.
